# Patient Record
Sex: Male | Born: 1980 | Race: White | Hispanic: No | Smoking: Never smoker
Health system: Southern US, Community
[De-identification: ages and names within clinical notes are randomized; demographics above are authoritative.]

## PROBLEM LIST (undated history)

## (undated) ENCOUNTER — Emergency Department: Admission: EM | Disposition: A | Discharge status: Home / Self Care | Payer: Self-pay

## (undated) DIAGNOSIS — E079 Disorder of thyroid, unspecified: Secondary | ICD-10-CM

## (undated) DIAGNOSIS — E039 Hypothyroidism, unspecified: Secondary | ICD-10-CM

## (undated) DIAGNOSIS — I10 Essential (primary) hypertension: Secondary | ICD-10-CM

## (undated) HISTORY — PX: HERNIA REPAIR: SHX51

## (undated) HISTORY — PX: KNEE SURGERY: SHX244

---

## 2009-07-04 ENCOUNTER — Ambulatory Visit: Payer: Self-pay | Admitting: Internal Medicine

## 2009-12-09 ENCOUNTER — Ambulatory Visit: Payer: Self-pay | Admitting: Internal Medicine

## 2010-12-09 ENCOUNTER — Ambulatory Visit: Payer: Self-pay | Admitting: Family Medicine

## 2011-04-07 ENCOUNTER — Ambulatory Visit: Payer: Self-pay | Admitting: Family Medicine

## 2012-02-21 ENCOUNTER — Ambulatory Visit: Payer: Self-pay

## 2012-02-22 ENCOUNTER — Ambulatory Visit: Payer: Self-pay | Admitting: Internal Medicine

## 2012-02-23 ENCOUNTER — Ambulatory Visit: Payer: Self-pay | Admitting: Family Medicine

## 2012-02-25 ENCOUNTER — Ambulatory Visit: Payer: Self-pay | Admitting: Internal Medicine

## 2012-09-16 ENCOUNTER — Ambulatory Visit: Payer: Self-pay | Admitting: Internal Medicine

## 2013-01-15 ENCOUNTER — Ambulatory Visit: Payer: Self-pay

## 2013-01-17 ENCOUNTER — Ambulatory Visit: Payer: Self-pay | Admitting: Emergency Medicine

## 2014-07-29 ENCOUNTER — Ambulatory Visit: Payer: Self-pay | Admitting: Physician Assistant

## 2014-12-13 ENCOUNTER — Ambulatory Visit
Admit: 2014-12-13 | Disposition: A | Discharge status: Home / Self Care | Payer: Self-pay | Attending: Family Medicine | Admitting: Family Medicine

## 2016-09-19 ENCOUNTER — Ambulatory Visit
Admission: EM | Admit: 2016-09-19 | Discharge: 2016-09-19 | Disposition: A | Discharge status: Home / Self Care | Payer: Self-pay

## 2018-11-22 ENCOUNTER — Other Ambulatory Visit: Payer: Self-pay

## 2018-11-22 ENCOUNTER — Ambulatory Visit
Admission: EM | Admit: 2018-11-22 | Discharge: 2018-11-22 | Disposition: A | Discharge status: Home / Self Care | Payer: Self-pay | Attending: Family Medicine | Admitting: Family Medicine

## 2018-11-22 ENCOUNTER — Ambulatory Visit (INDEPENDENT_AMBULATORY_CARE_PROVIDER_SITE_OTHER): Payer: Self-pay

## 2018-11-22 ENCOUNTER — Encounter: Payer: Self-pay | Admitting: Emergency Medicine

## 2018-11-22 DIAGNOSIS — R509 Fever, unspecified: Secondary | ICD-10-CM

## 2018-11-22 DIAGNOSIS — J209 Acute bronchitis, unspecified: Secondary | ICD-10-CM

## 2018-11-22 DIAGNOSIS — R05 Cough: Secondary | ICD-10-CM

## 2018-11-22 HISTORY — DX: Essential (primary) hypertension: I10

## 2018-11-22 HISTORY — DX: Disorder of thyroid, unspecified: E07.9

## 2018-11-22 MED ORDER — AZITHROMYCIN 250 MG PO TABS: ORAL_TABLET | ORAL | 0 refills | Status: DC

## 2018-11-22 MED ORDER — HYDROCODONE-HOMATROPINE 5-1.5 MG/5ML PO SYRP: 5.0000 mL | ORAL_SOLUTION | Freq: Four times a day (QID) | ORAL | 0 refills | Status: DC | PRN

## 2018-11-22 NOTE — ED Triage Notes (Signed)
Patient c/o fever, cough, chills and bodyaches that started on Saturday. He had recent exposure to his girlfriend on Tuesday who tested positive for the flu.

## 2018-11-22 NOTE — Discharge Instructions (Signed)
Rest  Medication as prescribed.  Take care  Dr. Atziry Baranski  

## 2018-11-22 NOTE — ED Provider Notes (Signed)
[facility]    CSN: [id number] Arrival date & time: 11/22/18  1544  History   Chief Complaint Chief Complaint  Patient presents with  . Fever    APPT  . Cough  . Generalized Body Aches   HPI  38 year old male presents with cough.  Patient reports that he has been feeling poorly since 3/2.  He appears to have contracted influenza.  Several individuals were at a party/gathering and subsequently ended up having influenza.  He is girlfriend tested positive on 3/3.  Patient states that he continues to have low-grade fever, T-max 100.  He reports cough and associated chest congestion.  Patient feels that he has not improved.  No known exacerbating or relieving factors.  Patient is concerned that it has "moved to my chest".  No other associated symptoms.  No other complaints.  Hx reviewed as below. Past Medical History:  Diagnosis Date  . Hypertension   . Thyroid disease    History reviewed. No pertinent surgical history.  Home Medications    Prior to Admission medications   Medication Sig Start Date End Date Taking? Authorizing Provider  diltiazem (TIAZAC) 240 MG 24 hr capsule Take 240 mg by mouth daily.   Yes [provider]  hydrochlorothiazide (HYDRODIURIL) 25 MG tablet Take 25 mg by mouth daily.   Yes [provider]  levothyroxine (SYNTHROID, LEVOTHROID) 25 MCG tablet Take 25 mcg by mouth daily before breakfast.   Yes [provider]  azithromycin (ZITHROMAX) 250 MG tablet 2 tablets on day 1, then 1 tablet daily on days 2-5. 11/22/18   Lavel Rieman, Verdis Frederickson, DO  HYDROcodone-homatropine (HYCODAN) 5-1.5 MG/5ML syrup Take 5 mLs by mouth every 6 (six) hours as needed. 11/22/18   Tommie Sams, DO   Social History Social History   Tobacco Use  . Smoking status: Never Smoker  . Smokeless tobacco: Current User    Types: Chew  Substance Use Topics  . Alcohol use: Not Currently  . Drug use: Never     Allergies   Patient has no known allergies.   Review of Systems Review of Systems  Constitutional: Positive for fever.  Respiratory: Positive for cough.        Chest congestion.   Physical Exam Triage Vital Signs ED Triage Vitals  Enc Vitals Group     BP 11/22/18 1552 (!) 137/98     Pulse Rate 11/22/18 1552 78     Resp 11/22/18 1552 18     Temp 11/22/18 1552 98.2 F (36.8 C)     Temp Source 11/22/18 1552 Oral     SpO2 11/22/18 1552 99 %     Weight 11/22/18 1549 270 lb (122.5 kg)     Height 11/22/18 1549 6\' 4"  (1.93 m)     Head Circumference --      Peak Flow --      Pain Score 11/22/18 1549 0     Pain Loc --      Pain Edu? --      Excl. in GC? --    Updated Vital Signs BP (!) 137/98 (BP Location: Right Arm)   Pulse 78   Temp 98.2 F (36.8 C) (Oral)   Resp 18   Ht 6\' 4"  (1.93 m)   Wt 122.5 kg   SpO2 99%   BMI 32.87 kg/m   Visual Acuity Right Eye Distance:   Left Eye Distance:   Bilateral Distance:    Right Eye Near:   Left  Eye Near:    Bilateral Near:     Physical Exam Vitals signs and nursing note reviewed.  Constitutional:      General: He is not in acute distress.    Appearance: Normal appearance. He is obese.  HENT:     Head: Normocephalic and atraumatic.     Right Ear: Tympanic membrane normal.     Left Ear: Tympanic membrane normal.     Mouth/Throat:     Pharynx: Oropharynx is clear. Posterior oropharyngeal erythema present. No oropharyngeal exudate.  Eyes:     General:        Right eye: No discharge.        Left eye: No discharge.     Conjunctiva/sclera: Conjunctivae normal.  Cardiovascular:     Rate and Rhythm: Normal rate and regular rhythm.  Pulmonary:     Effort: Pulmonary effort is normal.     Breath sounds: Normal breath sounds.  Neurological:     Mental Status: He is alert.  Psychiatric:        Mood and Affect: Mood normal.        Behavior: Behavior normal.    UC Treatments / Results  Labs (all labs ordered are listed, but only abnormal results are displayed) Labs  Reviewed - No data to display  EKG None  Radiology Dg Chest 2 View  Result Date: 11/22/2018 CLINICAL DATA:  39 year old male with fever, cough, chills and body aches. Recent exposure to influenza. EXAM: CHEST - 2 VIEW COMPARISON:  None. FINDINGS: The lungs are clear and negative for focal airspace consolidation, pulmonary edema or suspicious pulmonary nodule. No pleural effusion or pneumothorax. Cardiac and mediastinal contours are within normal limits. No acute fracture or lytic or blastic osseous lesions. The visualized upper abdominal bowel gas pattern is unremarkable. IMPRESSION: Negative chest x-ray. Electronically Signed   By: Malachy Moan M.D.   On: 11/22/2018 16:25    Procedures Procedures (including critical care time)  Medications Ordered in UC Medications - No data to display  Initial Impression / Assessment and Plan / UC Course  I have reviewed the triage vital signs and the nursing notes.  Pertinent labs & imaging results that were available during my care of the patient were reviewed by me and considered in my medical decision making (see chart for details).    38 year old male presents with acute bronchitis.  X-ray negative.  Concern for superimposed bacterial infection given recent influenza.  Treating with azithromycin and Hycodan for cough.  Final Clinical Impressions(s) / UC Diagnoses   Final diagnoses:  Acute bronchitis, unspecified organism     Discharge Instructions     Rest.   Medication as prescribed.  Take care  Dr. Adriana Simas     ED Prescriptions    Medication Sig Dispense Auth. Provider   azithromycin (ZITHROMAX) 250 MG tablet 2 tablets on day 1, then 1 tablet daily on days 2-5. 6 tablet Zamari Bonsall G, DO   HYDROcodone-homatropine (HYCODAN) 5-1.5 MG/5ML syrup Take 5 mLs by mouth every 6 (six) hours as needed. 120 mL Tommie Sams, DO     Controlled Substance Prescriptions [place] Controlled Substance Registry consulted? Not Applicable   Tommie Sams, DO 11/22/18 1712

## 2019-04-23 ENCOUNTER — Ambulatory Visit
Admission: EM | Admit: 2019-04-23 | Discharge: 2019-04-23 | Disposition: A | Discharge status: Home / Self Care | Payer: Self-pay | Attending: Family Medicine | Admitting: Family Medicine

## 2019-04-23 ENCOUNTER — Ambulatory Visit (INDEPENDENT_AMBULATORY_CARE_PROVIDER_SITE_OTHER): Payer: Self-pay

## 2019-04-23 ENCOUNTER — Other Ambulatory Visit: Payer: Self-pay

## 2019-04-23 DIAGNOSIS — S52124A Nondisplaced fracture of head of right radius, initial encounter for closed fracture: Secondary | ICD-10-CM

## 2019-04-23 DIAGNOSIS — W1692XA Jumping or diving into unspecified water causing other injury, initial encounter: Secondary | ICD-10-CM

## 2019-04-23 DIAGNOSIS — S29012A Strain of muscle and tendon of back wall of thorax, initial encounter: Secondary | ICD-10-CM

## 2019-04-23 DIAGNOSIS — M25521 Pain in right elbow: Secondary | ICD-10-CM

## 2019-04-23 DIAGNOSIS — S5000XA Contusion of unspecified elbow, initial encounter: Secondary | ICD-10-CM

## 2019-04-23 DIAGNOSIS — S5001XA Contusion of right elbow, initial encounter: Secondary | ICD-10-CM

## 2019-04-23 DIAGNOSIS — S29019A Strain of muscle and tendon of unspecified wall of thorax, initial encounter: Secondary | ICD-10-CM

## 2019-04-23 DIAGNOSIS — S300XXA Contusion of lower back and pelvis, initial encounter: Secondary | ICD-10-CM

## 2019-04-23 DIAGNOSIS — M545 Low back pain: Secondary | ICD-10-CM

## 2019-04-23 MED ORDER — OXYCODONE-ACETAMINOPHEN 5-325 MG PO TABS: 1.0000 | ORAL_TABLET | Freq: Three times a day (TID) | ORAL | 0 refills | Status: DC | PRN

## 2019-04-23 NOTE — Discharge Instructions (Addendum)
Take medication as prescribed.  Over-the-counter ibuprofen as needed.  Keep in splint.  Ice.  Elevate.  Rest. Drink plenty of fluids.   Follow-up with orthopedic this coming week as discussed.  See above to call to schedule.  Follow up with your primary care physician this week as needed. Return to Urgent care for new or worsening concerns.
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
## 2019-05-15 ENCOUNTER — Telehealth: Payer: Self-pay

## 2019-05-15 ENCOUNTER — Ambulatory Visit (INDEPENDENT_AMBULATORY_CARE_PROVIDER_SITE_OTHER)
Admission: RE | Admit: 2019-05-15 | Discharge: 2019-05-15 | Disposition: A | Discharge status: Home / Self Care | Payer: Self-pay | Source: Ambulatory Visit

## 2019-05-15 DIAGNOSIS — J0140 Acute pansinusitis, unspecified: Secondary | ICD-10-CM

## 2019-05-15 DIAGNOSIS — Z20822 Contact with and (suspected) exposure to covid-19: Secondary | ICD-10-CM

## 2019-05-15 DIAGNOSIS — Z20828 Contact with and (suspected) exposure to other viral communicable diseases: Secondary | ICD-10-CM

## 2019-05-15 MED ORDER — AMOXICILLIN-POT CLAVULANATE 875-125 MG PO TABS: 1.0000 | ORAL_TABLET | Freq: Two times a day (BID) | ORAL | 0 refills | Status: AC

## 2019-05-15 NOTE — ED Provider Notes (Signed)
[place]    Virtual Visit via Video Note:  Wesley Cortez.  initiated request for Telemedicine visit with [name]. I connected with Wesley Cortez.  on 05/15/2019 at 1:13 PM  for a synchronized telemedicine visit using a telephone enabled HIPPA compliant telemedicine application. I verified that I am speaking with Wesley Cortez.  using two identifiers. Wesley Box, PA-C  was physically located in a [place] [facility] site and Wesley Cortez [employer]. was located at a different location.   The limitations of evaluation and management by telemedicine as well as the availability of in-person appointments were discussed. Patient was informed that he  may incur a bill ( including co-pay) for this virtual visit encounter. Wesley Gamer Sturgell Jr.  expressed understanding and gave verbal consent to proceed with virtual visit.  [id number] 05/15/19 Arrival Time: 1257  Cc: "sinus infection"  SUBJECTIVE:  Wesley Cortez. is a 38 y.o. male who presents with sinus congestion and pain x 5 days.  Denies sick exposure to COVID, flu or strep.  Denies recent travel.  However, does mention fiance is a Therapist, sports at [employer] for pediatric oncology.  Recently she was tested for COVID for flu-like symptoms and had a negative COVID test.  Localizes discomfort to the frontal and maxillary sinuses.  Has tried OTC medications with minimal relief.  Reports previous symptoms in the past related to sinus infection.  Complains of malaise, sinus HA, cough, PND, and mild nausea.   Denies fever, chills, SOB, wheezing, chest pain, vomiting, changes in bowel or bladder habits.    ROS: As per HPI.  All other pertinent ROS negative.     Past Medical History:  Diagnosis Date  . Hypertension   . Thyroid disease    History reviewed. No pertinent surgical history. No Known Allergies No current facility-administered medications on file prior to encounter.    Current  Outpatient Medications on File Prior to Encounter  Medication Sig Dispense Refill  . FLUoxetine (PROZAC) 10 MG tablet Take 10 mg by mouth daily.    Marland Kitchen amphetamine-dextroamphetamine (ADDERALL XR) 15 MG 24 hr capsule TK ONE C PO QD    . diltiazem (TIAZAC) 240 MG 24 hr capsule Take 240 mg by mouth daily.    Marland Kitchen escitalopram (LEXAPRO) 10 MG tablet TK 1 T PO QD    . gabapentin (NEURONTIN) 300 MG capsule TK 1 TO 3 CS PO QHS    . hydrochlorothiazide (HYDRODIURIL) 25 MG tablet Take 25 mg by mouth daily.    Marland Kitchen levothyroxine (SYNTHROID, LEVOTHROID) 25 MCG tablet Take 25 mcg by mouth daily before breakfast.        OBJECTIVE:  There were no vitals filed for this visit.  PE done over telephone  General: alert; no distress HENT: no hot potato voice Lungs: normal respiratory effort; speaking in full sentences without difficulty Neurologic: No slurred speech  Psychological: alert and cooperative; normal mood and affect  ASSESSMENT & PLAN:  1. Suspected Covid-19 Virus Infection   2. Acute non-recurrent pansinusitis     Meds ordered this encounter  Medications  . amoxicillin-clavulanate (AUGMENTIN) 875-125 MG tablet    Sig: Take 1 tablet by mouth every 12 (twelve) hours for 10 days.    Dispense:  20 tablet    Refill:  0    Order Specific Question:   Supervising Provider    Answer:   Wesley Cortez [[id number]]    Orders Placed This  Encounter  Procedures  . Novel Coronavirus, NAA ([facility])    Order Specific Question:   Is this test for diagnosis or screening    Answer:   Diagnosis of ill patient    Order Specific Question:   Symptomatic for COVID-19 as defined by CDC    Answer:   Yes    Order Specific Question:   Date of Symptom Onset    Answer:   05/10/2019    Order Specific Question:   Hospitalized for COVID-19    Answer:   No    Order Specific Question:   Admitted to ICU for COVID-19    Answer:   No    Order Specific Question:   Previously tested for COVID-19    Answer:   No     Order Specific Question:   Resident in a congregate (group) care setting    Answer:   No    Order Specific Question:   Is the patient student?    Answer:   No    Order Specific Question:   Employed in healthcare setting    Answer:   No    COVID testing ordered.  You may go to the drive-through testing center in [place].  Address is [address] In [place], [place].  Opened M-F; 8-4p  In the meantime: You should remain isolated in your home for 10 days from symptom onset AND greater than 72 hours after symptoms resolution (absence of fever without the use of fever-reducing medication and improvement in respiratory symptoms), whichever is longer Get plenty of rest and push fluids Based on symptoms I will go ahead and treat you for possible sinus infection as well.  Take antibiotic as prescribed and to completion.   Use OTC medications like ibuprofen or tylenol as needed fever or pain Follow up in person, call or go to the ED if you have any new or worsening symptoms such as fever, worsening cough, shortness of breath, chest tightness, chest pain, turning blue, changes in mental status, etc...   I discussed the assessment and treatment plan with the patient. The patient was provided an opportunity to ask questions and all were answered. The patient agreed with the plan and demonstrated an understanding of the instructions.   The patient was advised to call back or seek an in-person evaluation if the symptoms worsen or if the condition fails to improve as anticipated.  I provided 15 minutes of non-face-to-face time during this encounter.  [name], PA-C  05/15/2019 1:13 PM           Wesley HardingWurst, Wesley Longstreth, PA-C 05/15/19 1409

## 2019-05-15 NOTE — Discharge Instructions (Addendum)
COVID testing ordered.  You may go to the drive-through testing center in [place].  Address is [address] [place], [place].  Opened M-F; 8-4p  In the meantime: You should remain isolated in your home for 10 days from symptom onset AND greater than 72 hours after symptoms resolution (absence of fever without the use of fever-reducing medication and improvement in respiratory symptoms), whichever is longer Get plenty of rest and push fluids Based on symptoms I will go ahead and treat you for possible sinus infection as well.  Take antibiotic as prescribed and to completion.   Use OTC medications like ibuprofen or tylenol as needed fever or pain Follow up in person, call or go to the ED if you have any new or worsening symptoms such as fever, worsening cough, shortness of breath, chest tightness, chest pain, turning blue, changes in mental status, etc..Marland Kitchen

## 2019-05-16 ENCOUNTER — Other Ambulatory Visit: Payer: Self-pay

## 2019-05-16 DIAGNOSIS — Z20822 Contact with and (suspected) exposure to covid-19: Secondary | ICD-10-CM

## 2019-05-17 LAB — NOVEL CORONAVIRUS, NAA: SARS-CoV-2, NAA: NOT DETECTED

## 2019-06-01 ENCOUNTER — Telehealth: Payer: Self-pay

## 2019-06-01 ENCOUNTER — Encounter: Payer: Self-pay | Admitting: Emergency Medicine

## 2019-06-01 ENCOUNTER — Other Ambulatory Visit: Payer: Self-pay

## 2019-06-01 ENCOUNTER — Ambulatory Visit
Admission: EM | Admit: 2019-06-01 | Discharge: 2019-06-01 | Disposition: A | Discharge status: Home / Self Care | Payer: Self-pay | Attending: Family Medicine | Admitting: Family Medicine

## 2019-06-01 DIAGNOSIS — R6889 Other general symptoms and signs: Secondary | ICD-10-CM

## 2019-06-01 DIAGNOSIS — R05 Cough: Secondary | ICD-10-CM

## 2019-06-01 DIAGNOSIS — Z20822 Contact with and (suspected) exposure to covid-19: Secondary | ICD-10-CM

## 2019-06-01 DIAGNOSIS — J029 Acute pharyngitis, unspecified: Secondary | ICD-10-CM

## 2019-06-01 LAB — RAPID INFLUENZA A&B ANTIGENS
Influenza A (ARMC): NEGATIVE
Influenza B (ARMC): NEGATIVE

## 2019-06-01 LAB — RAPID STREP SCREEN (MED CTR MEBANE ONLY): Streptococcus, Group A Screen (Direct): NEGATIVE

## 2019-06-01 MED ORDER — MELOXICAM 15 MG PO TABS: 15.0000 mg | ORAL_TABLET | Freq: Every day | ORAL | 0 refills | Status: DC | PRN

## 2019-06-01 MED ORDER — BENZONATATE 200 MG PO CAPS: 200.0000 mg | ORAL_CAPSULE | Freq: Three times a day (TID) | ORAL | 0 refills | Status: DC | PRN

## 2019-06-01 NOTE — Discharge Instructions (Signed)
Rest. Fluids.  Tylenol as needed.  Mobic for pain/body aches.  Cough medication as needed.  If you worsen, go to the ER.  Take care  Dr. Lacinda Axon

## 2019-06-01 NOTE — ED Triage Notes (Addendum)
Pt c/o fever(102), chills, SOB, sore throat, body aches, cough, shortness of breath and  Nausea. Started about 3 days ago. Pt last too ibuprofen about 10:30 am

## 2019-06-01 NOTE — ED Provider Notes (Signed)
[facility]    CSN: [id number] Arrival date & time: 06/01/19  1225  History   Chief Complaint Chief Complaint  Patient presents with  . Fever  . Cough  . Sore Throat   HPI  38 year old male presents with respiratory symptoms.  Patient reports that he has been sick for the past 3 days.  He reports cough, congestion, sore throat, fever, chills, weakness, fatigue, shortness of breath, headache, body aches.  No reported sick contacts.  He states his fever has been as high as 102.  States that he feels very poorly.  He took ibuprofen this morning.  No known exacerbating or relieving factors.  He is concerned that he may have a sinus infection.  I am concerned that he may have COVID-19.  Patient states that his symptoms are similar to when he has been flu previously.  He rates his pain as a 5/10 in severity.  No other associated symptoms.  No other complaints.  History reviewed and updated as below.  Past Medical History:  Diagnosis Date  . Hypertension   . Thyroid disease    Past Surgical History:  Procedure Laterality Date  . NO PAST SURGERIES     Home Medications    Prior to Admission medications   Medication Sig Start Date End Date Taking? Authorizing Provider  amphetamine-dextroamphetamine (ADDERALL XR) 15 MG 24 hr capsule TK ONE C PO QD 03/28/19  Yes [provider]  diltiazem (TIAZAC) 240 MG 24 hr capsule Take 240 mg by mouth daily.   Yes [provider]  FLUoxetine (PROZAC) 10 MG tablet Take 10 mg by mouth daily.   Yes [provider]  gabapentin (NEURONTIN) 300 MG capsule TK 1 TO 3 CS PO QHS 04/12/19  Yes [provider]  hydrochlorothiazide (HYDRODIURIL) 25 MG tablet Take 25 mg by mouth daily.   Yes [provider]  levothyroxine (SYNTHROID, LEVOTHROID) 25 MCG tablet Take 25 mcg by mouth daily before breakfast.   Yes [provider]  benzonatate (TESSALON) 200 MG capsule Take 1 capsule (200 mg total) by mouth  3 (three) times daily as needed for cough. 06/01/19   Coral Spikes, DO  escitalopram (LEXAPRO) 10 MG tablet TK 1 T PO QD 04/12/19   [provider]  meloxicam (MOBIC) 15 MG tablet Take 1 tablet (15 mg total) by mouth daily as needed for pain. 06/01/19   Coral Spikes, DO   Social History Social History   Tobacco Use  . Smoking status: Never Smoker  . Smokeless tobacco: Current User    Types: Chew  Substance Use Topics  . Alcohol use: Not Currently  . Drug use: Never   Allergies   Patient has no known allergies.   Review of Systems Review of Systems Per HPI  Physical Exam Triage Vital Signs ED Triage Vitals  Enc Vitals Group     BP 06/01/19 1249 (!) 123/92     Pulse Rate 06/01/19 1249 (!) 105     Resp 06/01/19 1249 20     Temp 06/01/19 1249 99.2 F (37.3 C)     Temp Source 06/01/19 1249 Oral     SpO2 06/01/19 1249 99 %     Weight 06/01/19 1244 275 lb (124.7 kg)     Height 06/01/19 1244 6\' 4"  (1.93 m)     Head Circumference --      Peak Flow --      Pain Score 06/01/19 1243 5     Pain  Loc --      Pain Edu? --      Excl. in GC? --    Updated Vital Signs BP (!) 123/92 (BP Location: Left Arm)   Pulse (!) 105   Temp 99.2 F (37.3 C) (Oral)   Resp 20   Ht 6\' 4"  (1.93 m)   Wt 124.7 kg   SpO2 99%   BMI 33.47 kg/m   Visual Acuity Right Eye Distance:   Left Eye Distance:   Bilateral Distance:    Right Eye Near:   Left Eye Near:    Bilateral Near:     Physical Exam Vitals signs and nursing note reviewed.  Constitutional:      General: He is not in acute distress.    Appearance: Normal appearance. He is not ill-appearing.  HENT:     Head: Normocephalic and atraumatic.     Right Ear: Tympanic membrane normal.     Left Ear: Tympanic membrane normal.     Mouth/Throat:     Pharynx: Posterior oropharyngeal erythema present. No oropharyngeal exudate.  Eyes:     General:        Right eye: No discharge.        Left eye: No discharge.      Conjunctiva/sclera: Conjunctivae normal.  Cardiovascular:     Rate and Rhythm: Normal rate and regular rhythm.  Pulmonary:     Effort: Pulmonary effort is normal.     Breath sounds: Normal breath sounds. No wheezing, rhonchi or rales.  Neurological:     Mental Status: He is alert.  Psychiatric:        Mood and Affect: Mood normal.        Behavior: Behavior normal.    UC Treatments / Results  Labs (all labs ordered are listed, but only abnormal results are displayed) Labs Reviewed  RAPID STREP SCREEN ([facility] ONLY)  RAPID INFLUENZA A&B ANTIGENS ([facility] ONLY)  NOVEL CORONAVIRUS, NAA (HOSP ORDER, SEND-OUT TO REF LAB; TAT 18-24 HRS)  CULTURE, GROUP A STREP [facility])    EKG   Radiology No results found.  Procedures Procedures (including critical care time)  Medications Ordered in UC Medications - No data to display  Initial Impression / Assessment and Plan / UC Course  I have reviewed the triage vital signs and the nursing notes.  Pertinent labs & imaging results that were available during my care of the patient were reviewed by me and considered in my medical decision making (see chart for details).    38 year old male presents with suspected COVID-19.  Flu negative.  Strep negative.  Symptomatic care.  Meloxicam as needed for pain and body aches.  Tessalon Perles for cough.  Awaiting COVID-19 test results.  Final Clinical Impressions(s) / UC Diagnoses   Final diagnoses:  Suspected Covid-19 Virus Infection     Discharge Instructions     Rest. Fluids.  Tylenol as needed.  Mobic for pain/body aches.  Cough medication as needed.  If you worsen, go to the ER.  Take care  Dr. Adriana Simasook    ED Prescriptions    Medication Sig Dispense Auth. Provider   meloxicam (MOBIC) 15 MG tablet Take 1 tablet (15 mg total) by mouth daily as needed for pain. 14 tablet Oyindamola Key G, DO   benzonatate (TESSALON) 200 MG capsule Take 1 capsule (200 mg total) by mouth 3 (three)  times daily as needed for cough. 30 capsule Tommie Samsook, Shaul Trautman G, DO     PDMP not reviewed this  encounter.   Tommie Sams, [place] 06/01/19 1511

## 2019-06-02 LAB — NOVEL CORONAVIRUS, NAA (HOSP ORDER, SEND-OUT TO REF LAB; TAT 18-24 HRS): SARS-CoV-2, NAA: NOT DETECTED

## 2019-06-04 ENCOUNTER — Telehealth (HOSPITAL_COMMUNITY): Payer: Self-pay | Admitting: Emergency Medicine

## 2019-06-04 ENCOUNTER — Encounter (HOSPITAL_COMMUNITY): Payer: Self-pay

## 2019-06-04 LAB — CULTURE, GROUP A STREP (THRC)

## 2019-06-04 MED ORDER — AMOXICILLIN 500 MG PO CAPS: 500.0000 mg | ORAL_CAPSULE | Freq: Two times a day (BID) | ORAL | 0 refills | Status: AC

## 2019-06-04 NOTE — Telephone Encounter (Signed)
Attempted to reach patient. No answer at this time. Voicemail left. Will send amoxicillin per Dr. Lanny Cramp since pt still symptomatic.

## 2019-06-04 NOTE — Telephone Encounter (Signed)
Pt returned call, informed of his strep culture. Pt c/o continued symptoms. Pt informed that we sent antibiotic for him and if still having symptoms once finished, to follow up with PCP. Pt agreeable to plan.

## 2019-07-07 ENCOUNTER — Other Ambulatory Visit: Payer: Self-pay | Admitting: Family Medicine

## 2019-07-11 ENCOUNTER — Ambulatory Visit (INDEPENDENT_AMBULATORY_CARE_PROVIDER_SITE_OTHER): Payer: Self-pay

## 2019-07-11 ENCOUNTER — Encounter: Payer: Self-pay | Admitting: Emergency Medicine

## 2019-07-11 ENCOUNTER — Ambulatory Visit
Admission: EM | Admit: 2019-07-11 | Discharge: 2019-07-11 | Disposition: A | Discharge status: Home / Self Care | Payer: Self-pay | Attending: Family Medicine | Admitting: Family Medicine

## 2019-07-11 ENCOUNTER — Other Ambulatory Visit: Payer: Self-pay

## 2019-07-11 DIAGNOSIS — R05 Cough: Secondary | ICD-10-CM

## 2019-07-11 DIAGNOSIS — R058 Other specified cough: Secondary | ICD-10-CM

## 2019-07-11 DIAGNOSIS — R059 Cough, unspecified: Secondary | ICD-10-CM

## 2019-07-11 DIAGNOSIS — Z7189 Other specified counseling: Secondary | ICD-10-CM

## 2019-07-11 MED ORDER — ALBUTEROL SULFATE HFA 108 (90 BASE) MCG/ACT IN AERS: 2.0000 | INHALATION_SPRAY | Freq: Four times a day (QID) | RESPIRATORY_TRACT | 0 refills | Status: DC | PRN

## 2019-07-11 MED ORDER — PREDNISONE 10 MG PO TABS: ORAL_TABLET | ORAL | 0 refills | Status: DC

## 2019-07-11 MED ORDER — HYDROCOD POLST-CPM POLST ER 10-8 MG/5ML PO SUER: 5.0000 mL | Freq: Every evening | ORAL | 0 refills | Status: DC | PRN

## 2019-07-11 MED ORDER — DOXYCYCLINE HYCLATE 100 MG PO CAPS: 100.0000 mg | ORAL_CAPSULE | Freq: Two times a day (BID) | ORAL | 0 refills | Status: DC

## 2019-07-11 NOTE — ED Provider Notes (Signed)
[facility] ____________________________________________  Time seen: Approximately 2:44 PM  I have reviewed the triage vital signs and the nursing notes.   HISTORY  Chief Complaint Cough  HPI Wesley Cortez. is a 38 y.o. male presenting for evaluation of cough congestion complaints present for approximately 1.5 months.  Patient reports symptoms initially started with flulike symptoms including fever, cough, congestion and sore throat.  States sore throat has since resolved, was tested positive for atypical strep and was treated with amoxicillin.  Patient reports Covid testing with it was initially negative.  Patient reports continues with postnasal drainage and cough.  Some soreness with cough and chest.  Denies chest pain.  Denies shortness of breath but does report he feels congestion and has to take deeper breaths sometimes.  Has coughing fits second to deep breaths or talking or laughing.  Cough worse at night.  Unresolved with over-the-counter medications.  Does report since initial testing he has had a positive Covid 19 exposure approximately 3 weeks ago.  No recent fevers.  Denies current change in taste or smell.  States had some changes in taste or smell initially.  Has continued to overall remain active.  Denies other aggravating or alleviating factors.   Past Medical History:  Diagnosis Date   Hypertension    Thyroid disease     There are no active problems to display for this patient.   Past Surgical History:  Procedure Laterality Date   NO PAST SURGERIES       No current facility-administered medications for this encounter.   Current Outpatient Medications:    amphetamine-dextroamphetamine (ADDERALL XR) 15 MG 24 hr capsule, TK ONE C PO QD, Disp: , Rfl:    diltiazem (TIAZAC) 240 MG 24 hr capsule, Take 240 mg by mouth daily., Disp: , Rfl:    FLUoxetine (PROZAC) 10 MG tablet, Take 10 mg by mouth daily., Disp: , Rfl:    gabapentin  (NEURONTIN) 300 MG capsule, TK 1 TO 3 CS PO QHS, Disp: , Rfl:    hydrochlorothiazide (HYDRODIURIL) 25 MG tablet, Take 25 mg by mouth daily., Disp: , Rfl:    levothyroxine (SYNTHROID, LEVOTHROID) 25 MCG tablet, Take 25 mcg by mouth daily before breakfast., Disp: , Rfl:    albuterol (VENTOLIN HFA) 108 (90 Base) MCG/ACT inhaler, Inhale 2 puffs into the lungs every 6 (six) hours as needed for wheezing., Disp: 6.7 g, Rfl: 0   chlorpheniramine-HYDROcodone (TUSSIONEX PENNKINETIC ER) 10-8 MG/5ML SUER, Take 5 mLs by mouth at bedtime as needed. do not drive or operate machinery while taking as can cause drowsiness., Disp: 40 mL, Rfl: 0   doxycycline (VIBRAMYCIN) 100 MG capsule, Take 1 capsule (100 mg total) by mouth 2 (two) times daily., Disp: 20 capsule, Rfl: 0   escitalopram (LEXAPRO) 10 MG tablet, TK 1 T PO QD, Disp: , Rfl:    predniSONE (DELTASONE) 10 MG tablet, Start 60 mg po day one, then 50 mg po day two, taper by 10 mg daily until complete., Disp: 21 tablet, Rfl: 0  Allergies Patient has no known allergies.  History reviewed. No pertinent family history.  Social History Social History   Tobacco Use   Smoking status: Never Smoker   Smokeless tobacco: Current User    Types: Chew  Substance Use Topics   Alcohol use: Not Currently   Drug use: Never    Review of Systems Constitutional: No fever. Eyes:As above.  Cardiovascular: Denies chest pain. Respiratory: Denies shortness of breath. Gastrointestinal: No abdominal pain.  No nausea, no vomiting.  No diarrhea.  Genitourinary: Negative for dysuria. Musculoskeletal: Negative for back pain. Skin: Negative for rash.   ____________________________________________   PHYSICAL EXAM:  VITAL SIGNS: ED Triage Vitals  Enc Vitals Group     BP 07/11/19 1415 (!) 142/101     Pulse Rate 07/11/19 1415 91     Resp 07/11/19 1415 18     Temp 07/11/19 1415 98.5 F (36.9 C)     Temp Source 07/11/19 1415 Oral     SpO2 07/11/19 1415 98  %     Weight 07/11/19 1411 265 lb (120.2 kg)     Height 07/11/19 1411 6\' 4"  (1.93 m)     Head Circumference --      Peak Flow --      Pain Score 07/11/19 1411 3     Pain Loc --      Pain Edu? --      Excl. in GC? --    Constitutional: Alert and oriented. Well appearing and in no acute distress. Eyes: Conjunctivae are normal. PERRL. EOMI. Head: Atraumatic. No sinus tenderness to palpation. No swelling. No erythema.  Ears: no erythema, normal TMs bilaterally.   Nose:Nasal congestion   Mouth/Throat: Mucous membranes are moist. No pharyngeal erythema. No tonsillar swelling or exudate.  Neck: No stridor.  No cervical spine tenderness to palpation. Hematological/Lymphatic/Immunilogical: No cervical lymphadenopathy. Cardiovascular: Normal rate, regular rhythm. Grossly normal heart sounds.  Good peripheral circulation. Respiratory: Normal respiratory effort.  No retractions. No wheezes, rales or rhonchi.  Mild left-sided rhonchi.  Speaks in complete sentences. Musculoskeletal: Ambulatory with steady gait.  No extremity edema noted. Neurologic:  Normal speech and language. No gait instability. Skin:  Skin appears warm, dry and intact. No rash noted. Psychiatric: Mood and affect are normal. Speech and behavior are normal. ___________________________________________   LABS (all labs ordered are listed, but only abnormal results are displayed)  Labs Reviewed  NOVEL CORONAVIRUS, NAA (HOSP ORDER, SEND-OUT TO REF LAB; TAT 18-24 HRS)   _______________________________________  RADIOLOGY  Dg Chest 2 View  Result Date: 07/11/2019 CLINICAL DATA:  Cough for 1.5 months EXAM: CHEST - 2 VIEW COMPARISON:  11/22/2018 FINDINGS: The heart size and mediastinal contours are within normal limits. Both lungs are clear. Minimal atelectasis or scar at the left base. The visualized skeletal structures are unremarkable. IMPRESSION: No active cardiopulmonary disease. Electronically Signed   By: 01/22/2019 M.D.    On: 07/11/2019 15:04   ____________________________________________   PROCEDURES Procedures    INITIAL IMPRESSION / ASSESSMENT AND PLAN / ED COURSE  Pertinent labs & imaging results that were available during my care of the patient were reviewed by me and considered in my medical decision making (see chart for details).  Overall well-appearing patient.  Does not appear ill.  Discussed likely initial viral illness.  As with more recent positive exposure, will repeat COVID-19 testing.  Chest x-ray as above per radiologist, reviewed, no active cardiopulmonary disease.  His cough has extended greater than 3 weeks, will empirically treat with oral doxycycline, prednisone taper, albuterol and as needed Tussionex.  Encourage rest, fluids, supportive care.Discussed indication, risks and benefits of medications with patient.  Discussed follow up with Primary care physician this week. Discussed follow up and return parameters including no resolution or any worsening concerns. Patient verbalized understanding and agreed to plan.   ____________________________________________   FINAL CLINICAL IMPRESSION(S) / ED DIAGNOSES  Final diagnoses:  Cough  Cough present for greater than 3 weeks  Advice  given about COVID-19 virus infection     ED Discharge Orders         Ordered    doxycycline (VIBRAMYCIN) 100 MG capsule  2 times daily     07/11/19 1510    predniSONE (DELTASONE) 10 MG tablet     07/11/19 1510    albuterol (VENTOLIN HFA) 108 (90 Base) MCG/ACT inhaler  Every 6 hours PRN     07/11/19 1510    chlorpheniramine-HYDROcodone (TUSSIONEX PENNKINETIC ER) 10-8 MG/5ML SUER  At bedtime PRN     07/11/19 1510           Note: This dictation was prepared with Dragon dictation along with smaller phrase technology. Any transcriptional errors that result from this process are unintentional.         Renford DillsMiller, Karalyn Kadel, NP 07/11/19 1535

## 2019-07-11 NOTE — ED Triage Notes (Signed)
Patient states he has had a non-productive cough since he was seen on 09/18.  He states he coughs "violently" and it is very painful when he coughs. 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 Patient states when he presses on his chest he has pain.

## 2019-07-11 NOTE — Discharge Instructions (Signed)
Take medication as prescribed. Rest. Drink plenty of fluids. Monitor.  ° °Follow up with your primary care physician this week as needed. Return to Urgent care for new or worsening concerns.  ° °

## 2019-07-12 LAB — NOVEL CORONAVIRUS, NAA (HOSP ORDER, SEND-OUT TO REF LAB; TAT 18-24 HRS): SARS-CoV-2, NAA: NOT DETECTED

## 2019-08-22 ENCOUNTER — Other Ambulatory Visit: Payer: Self-pay

## 2019-08-22 ENCOUNTER — Ambulatory Visit
Admission: EM | Admit: 2019-08-22 | Discharge: 2019-08-22 | Disposition: A | Discharge status: Home / Self Care | Payer: Self-pay | Attending: Family Medicine | Admitting: Family Medicine

## 2019-08-22 ENCOUNTER — Ambulatory Visit (INDEPENDENT_AMBULATORY_CARE_PROVIDER_SITE_OTHER): Payer: Self-pay

## 2019-08-22 ENCOUNTER — Encounter: Payer: Self-pay | Admitting: Emergency Medicine

## 2019-08-22 DIAGNOSIS — S93401A Sprain of unspecified ligament of right ankle, initial encounter: Secondary | ICD-10-CM

## 2019-08-22 DIAGNOSIS — M25571 Pain in right ankle and joints of right foot: Secondary | ICD-10-CM

## 2019-08-22 MED ORDER — MELOXICAM 15 MG PO TABS: 15.0000 mg | ORAL_TABLET | Freq: Every day | ORAL | 0 refills | Status: DC | PRN

## 2019-08-22 NOTE — Discharge Instructions (Signed)
Rest. Ice. Elevation.  Medication as prescribed.  Take care  Dr. Lacinda Axon

## 2019-08-22 NOTE — ED Provider Notes (Signed)
[facility]    CSN: [id number] Arrival date & time: 08/22/19  1641   History   Chief Complaint Chief Complaint  Patient presents with  . Ankle Injury    DOI 08/22/19   HPI  38 year old male presents with an ankle injury.  Patient states that he was coming down a ladder and missed a step.  He inadvertently twisted his right ankle.  Patient reports that he subsequently developed pain, swelling.  Patient has a small abrasion to the lateral aspect of the right ankle.  He rates his pain as 10/10 in severity.  No medications or interventions tried.  Patient came directly in for evaluation.  Worse with activity.  He is having difficulty bearing weight.  No relieving factors.  No other complaints.  PMH, Surgical Hx, Family Hx, Social History reviewed and updated as below.  Past Medical History:  Diagnosis Date  . Hypertension   . Thyroid disease    Past Surgical History:  Procedure Laterality Date  . HERNIA REPAIR    . KNEE SURGERY Left    Home Medications    Prior to Admission medications   Medication Sig Start Date End Date Taking? Authorizing Provider  amphetamine-dextroamphetamine (ADDERALL XR) 15 MG 24 hr capsule TK ONE C PO QD 03/28/19  Yes [provider]  diltiazem (TIAZAC) 240 MG 24 hr capsule Take 240 mg by mouth daily.   Yes [provider]  FLUoxetine (PROZAC) 10 MG tablet Take 10 mg by mouth daily.   Yes [provider]  gabapentin (NEURONTIN) 300 MG capsule TK 1 TO 3 CS PO QHS 04/12/19  Yes [provider]  hydrochlorothiazide (HYDRODIURIL) 25 MG tablet Take 25 mg by mouth daily.   Yes [provider]  levothyroxine (SYNTHROID, LEVOTHROID) 25 MCG tablet Take 25 mcg by mouth daily before breakfast.   Yes [provider]  meloxicam (MOBIC) 15 MG tablet Take 1 tablet (15 mg total) by mouth daily as needed for pain. 08/22/19   Tommie Samsook, Haven Pylant G, DO  albuterol (VENTOLIN HFA) 108 (90 Base) MCG/ACT inhaler Inhale 2  puffs into the lungs every 6 (six) hours as needed for wheezing. 07/11/19 08/22/19  Renford DillsMiller, [place], NP  escitalopram (LEXAPRO) 10 MG tablet TK 1 T PO QD 04/12/19 08/22/19  [provider]    Family History Family History  Problem Relation Age of Onset  . Allergies Mother   . Diabetes Mother        diet controlled  . Hypertension Mother   . Heart attack Father [id number]  . Lung cancer Father   . Hypertension Father   . Hyperlipidemia Father     Social History Social History   Tobacco Use  . Smoking status: Never Smoker  . Smokeless tobacco: Current User    Types: Chew  Substance Use Topics  . Alcohol use: Not Currently  . Drug use: Never     Allergies   Patient has no known allergies.   Review of Systems Review of Systems  Constitutional: Negative.   Musculoskeletal:       Right ankle pain/swelling.   Physical Exam Triage Vital Signs ED Triage Vitals  Enc Vitals Group     BP 08/22/19 1723 (!) 115/91     Pulse Rate 08/22/19 1723 97     Resp 08/22/19 1723 18     Temp 08/22/19 1723 98 F (36.7 C)     Temp Source 08/22/19 1723 Oral     SpO2 08/22/19 1723 97 %  Weight 08/22/19 1723 280 lb (127 kg)     Height 08/22/19 1723 6\' 4"  (1.93 m)     Head Circumference --      Peak Flow --      Pain Score 08/22/19 1722 10     Pain Loc --      Pain Edu? --      Excl. in GC? --    Updated Vital Signs BP (!) 115/91 (BP Location: Left Arm)   Pulse 97   Temp 98 F (36.7 C) (Oral)   Resp 18   Ht 6\' 4"  (1.93 m)   Wt 127 kg   SpO2 97%   BMI 34.08 kg/m   Visual Acuity Right Eye Distance:   Left Eye Distance:   Bilateral Distance:    Right Eye Near:   Left Eye Near:    Bilateral Near:     Physical Exam Vitals signs and nursing note reviewed.  Constitutional:      General: He is not in acute distress.    Appearance: Normal appearance. He is not ill-appearing.  HENT:     Head: Normocephalic and atraumatic.  Eyes:     General:        Right eye: No  discharge.        Left eye: No discharge.     Conjunctiva/sclera: Conjunctivae normal.  Cardiovascular:     Rate and Rhythm: Normal rate and regular rhythm.     Heart sounds: No murmur.  Pulmonary:     Effort: Pulmonary effort is normal.     Breath sounds: Normal breath sounds. No wheezing, rhonchi or rales.  Musculoskeletal:     Comments: Right ankle -swelling noted of the lateral malleolus.  Tenderness over the medial and lateral malleolus.  Neurological:     Mental Status: He is alert.  Psychiatric:        Mood and Affect: Mood normal.        Behavior: Behavior normal.    UC Treatments / Results  Labs (all labs ordered are listed, but only abnormal results are displayed) Labs Reviewed - No data to display  EKG   Radiology Dg Ankle Complete Right  Result Date: 08/22/2019 CLINICAL DATA:  Twisted ankle today. EXAM: RIGHT ANKLE - COMPLETE 3+ VIEW COMPARISON:  None. FINDINGS: Normal alignment no fracture. Lateral soft tissue swelling. No joint effusion. IMPRESSION: Lateral soft tissue swelling.  Negative for fracture Electronically Signed   By: M.D.   On: 08/22/2019 17:52    Procedures Procedures (including critical care time)  Medications Ordered in UC Medications - No data to display  Initial Impression / Assessment and Plan / UC Course  I have reviewed the triage vital signs and the nursing notes.  Pertinent labs & imaging results that were available during my care of the patient were reviewed by me and considered in my medical decision making (see chart for details).    38 year old male presents with an ankle sprain.  X-ray negative.  Rest, ice, elevation.  Meloxicam as directed.  Placed in a cam walker.  Supportive care.  Final Clinical Impressions(s) / UC Diagnoses   Final diagnoses:  Sprain of right ankle, unspecified ligament, initial encounter     Discharge Instructions     Rest. Ice. Elevation.  Medication as prescribed.  Take care   Dr. 14/05/2019    ED Prescriptions    Medication Sig Dispense Auth. Provider   meloxicam (MOBIC) 15 MG tablet Take 1 tablet (15 mg total)  by mouth daily as needed for pain. 30 tablet Coral Spikes, DO     PDMP not reviewed this encounter.   Coral Spikes, [place] 08/22/19 1949

## 2019-08-22 NOTE — ED Triage Notes (Signed)
Patient in today c/o right ankle injury today. Patient states he missed the last step of a ladder and twisted his ankle and fell.

## 2019-08-23 ENCOUNTER — Telehealth (HOSPITAL_COMMUNITY): Payer: Self-pay | Admitting: Urgent Care

## 2019-08-23 MED ORDER — KETOROLAC TROMETHAMINE 10 MG PO TABS: 10.0000 mg | ORAL_TABLET | Freq: Four times a day (QID) | ORAL | 0 refills | Status: DC | PRN

## 2019-08-23 NOTE — Telephone Encounter (Signed)
    Date: 08/23/2019  Name: Leana Gamer [employer]. DOB: 1981-03-30 MRN: [id number]  Re: Request for alternative intervention for pain  Spoke with Dr. Lacinda Axon regarding patient's complaints and presentation. Cook, DO asked me to switch patient to ketorolac. Rx to be sent to patient's pharmacy on file for the following medication.   Meds ordered this encounter  Medications  . ketorolac (TORADOL) 10 MG tablet    Sig: Take 1 tablet (10 mg total) by mouth every 6 (six) hours as needed.    Dispense:  20 tablet    Refill:  0   Honor Loh, MSN, APRN, FNP-C, CEN Advanced Practice Provider [facility] 08/23/2019 9:43 AM

## 2019-08-23 NOTE — Telephone Encounter (Signed)
Patient called stating the Meloxicam is not working for his pain and he was unable to sleep last night. He is requesting something stronger.

## 2019-11-12 DIAGNOSIS — U071 COVID-19: Secondary | ICD-10-CM

## 2019-11-12 HISTORY — DX: COVID-19: U07.1

## 2019-12-14 ENCOUNTER — Ambulatory Visit: Payer: Self-pay

## 2019-12-31 ENCOUNTER — Ambulatory Visit (INDEPENDENT_AMBULATORY_CARE_PROVIDER_SITE_OTHER)
Admission: RE | Admit: 2019-12-31 | Discharge: 2019-12-31 | Disposition: A | Discharge status: Other Acute Inpatient Hospital | Payer: Self-pay | Source: Ambulatory Visit

## 2019-12-31 ENCOUNTER — Other Ambulatory Visit: Payer: Self-pay

## 2019-12-31 DIAGNOSIS — R509 Fever, unspecified: Secondary | ICD-10-CM

## 2019-12-31 DIAGNOSIS — U071 COVID-19: Secondary | ICD-10-CM

## 2019-12-31 DIAGNOSIS — R0602 Shortness of breath: Secondary | ICD-10-CM

## 2019-12-31 DIAGNOSIS — R058 Other specified cough: Secondary | ICD-10-CM

## 2019-12-31 DIAGNOSIS — R05 Cough: Secondary | ICD-10-CM

## 2019-12-31 MED ORDER — BENZONATATE 100 MG PO CAPS: 100.0000 mg | ORAL_CAPSULE | Freq: Three times a day (TID) | ORAL | 0 refills | Status: DC | PRN

## 2019-12-31 MED ORDER — PROMETHAZINE-DM 6.25-15 MG/5ML PO SYRP: 5.0000 mL | ORAL_SOLUTION | Freq: Every evening | ORAL | 0 refills | Status: DC | PRN

## 2019-12-31 NOTE — ED Provider Notes (Addendum)
Virtual Visit via Video Note:  Wesley Cortez.  initiated request for Telemedicine visit with [facility] team. I connected with Wesley Cortez.  on 12/31/2019 at 4:35 PM  for a synchronized telemedicine visit using a video enabled HIPPA compliant telemedicine application. I verified that I am speaking with Wesley Cortez.  using two identifiers. Jaynee Eagles, PA-C  was physically located in a [facility]gent care site and Christy Friede [employer]. was located at a different location.   The limitations of evaluation and management by telemedicine as well as the availability of in-person appointments were discussed. Patient was informed that he  may incur a bill ( including co-pay) for this virtual visit encounter. Leana Gamer Chrisley Jr.  expressed understanding and gave verbal consent to proceed with virtual visit.     History of Present Illness:Wesley Cortez.  is a 39 y.o. male presents with 3 day hx of acute onset fever (ranges between 102-104F), difficulty shob, moderate-severe cough that started becoming productive yesterday. No hemoptysis. Patient has monitored his pulse oximetry, just walking one end to the house and back, feels winded/shob but his has not dropped below 94%. Ranges up to 96%. His daughter tested positive for COVID 19 on 12/26/2019. Has quarantined since then. However, he has 2 other children that have started to show symptoms. He contacted the health department and was advised to get tested this week but was not having sx at the time. Has a hx of allergies, sinus infections. Has been taking APAP, ibuprofen with very temporary relief. Has had otc cold medications. He is taking Zinc, Vit C. Has had minimal relief with these medications. Denies hx of lung disorders.   ROS  No current facility-administered medications for this encounter.   Current Outpatient Medications  Medication Sig Dispense Refill  . amphetamine-dextroamphetamine  (ADDERALL XR) 15 MG 24 hr capsule TK ONE C PO QD    . diltiazem (TIAZAC) 240 MG 24 hr capsule Take 240 mg by mouth daily.    Marland Kitchen FLUoxetine (PROZAC) 10 MG tablet Take 10 mg by mouth daily.    Marland Kitchen gabapentin (NEURONTIN) 300 MG capsule TK 1 TO 3 CS PO QHS    . hydrochlorothiazide (HYDRODIURIL) 25 MG tablet Take 25 mg by mouth daily.    Marland Kitchen ketorolac (TORADOL) 10 MG tablet Take 1 tablet (10 mg total) by mouth every 6 (six) hours as needed. 20 tablet 0  . levothyroxine (SYNTHROID, LEVOTHROID) 25 MCG tablet Take 25 mcg by mouth daily before breakfast.    . meloxicam (MOBIC) 15 MG tablet Take 1 tablet (15 mg total) by mouth daily as needed for pain. 30 tablet 0     No Known Allergies   Past Medical History:  Diagnosis Date  . Hypertension   . Thyroid disease     Past Surgical History:  Procedure Laterality Date  . HERNIA REPAIR    . KNEE SURGERY Left       Observations/Objective: Physical Exam Constitutional:      General: He is not in acute distress.    Appearance: Normal appearance. He is ill-appearing (lethargic). He is not toxic-appearing or diaphoretic.  HENT:     Head: Atraumatic.  Eyes:     General:        Right eye: No discharge.        Left eye: No discharge.     Extraocular Movements: Extraocular movements intact.  Pulmonary:     Effort: Pulmonary  effort is normal.  Neurological:     Mental Status: He is alert and oriented to person, place, and time.  Psychiatric:        Mood and Affect: Mood normal.        Behavior: Behavior normal.        Thought Content: Thought content normal.        Judgment: Judgment normal.      Assessment and Plan:  PDMP not reviewed this encounter.  1. Clinical diagnosis of COVID-19   2. Productive cough   3. Shortness of breath   4. Fever, unspecified    Given severity of his sx, patient is to report to the ER for further evaluation especially since we do not have x-ray on site; counseled that he needs COVID 19 testing, consideration  for chest x-ray, labs. Offered Tessalon, cough syrup medications. Recommended supportive care otherwise. Patient is agreeable and will try to find a way to have someone watch his kids while he presents to the ER.    Follow Up Instructions:    I discussed the assessment and treatment plan with the patient. The patient was provided an opportunity to ask questions and all were answered. The patient agreed with the plan and demonstrated an understanding of the instructions.   The patient was advised to call back or seek an in-person evaluation if the symptoms worsen or if the condition fails to improve as anticipated.  I provided 20 minutes of non-face-to-face time during this encounter.    Wallis Bamberg, PA-C  12/31/2019 4:35 PM        Wallis Bamberg, PA-C 12/31/19 1656

## 2019-12-31 NOTE — Discharge Instructions (Addendum)
We will manage this as a viral syndrome, very likely that you have COVID 19. For sore throat or cough try using a honey-based tea. Use 3 teaspoons of honey with juice squeezed from half lemon. Place shaved pieces of ginger into 1/2-1 cup of water and warm over stove top. Then mix the ingredients and repeat every 4 hours as needed. Please take Tylenol 500mg -650mg  once every 6 hours for fevers, aches and pains. Hydrate very well with at least 2 liters (64 ounces) of water. Eat light meals such as soups (chicken and noodles, chicken wild rice, vegetable).  Do not eat any foods that you are allergic to.  Start an antihistamine like Zyrtec, Allegra or Claritin for postnasal drainage, sinus congestion. You can take this together with pseudoephedrine (Sudafed) at a dose of 60 mg  three times a day or twice daily as needed for the same kind of congestion. However, limit your use of pseudoephedrine if you have high blood pressure or avoid altogether if you have abnormal heart rhythms, heart condition.

## 2020-01-01 ENCOUNTER — Other Ambulatory Visit: Payer: Self-pay

## 2020-01-01 ENCOUNTER — Ambulatory Visit (INDEPENDENT_AMBULATORY_CARE_PROVIDER_SITE_OTHER): Payer: HRSA Program

## 2020-01-01 ENCOUNTER — Ambulatory Visit
Admission: EM | Admit: 2020-01-01 | Discharge: 2020-01-01 | Disposition: A | Discharge status: Home / Self Care | Payer: HRSA Program | Attending: Family Medicine | Admitting: Family Medicine

## 2020-01-01 DIAGNOSIS — J988 Other specified respiratory disorders: Secondary | ICD-10-CM | POA: Diagnosis present

## 2020-01-01 DIAGNOSIS — U071 COVID-19: Secondary | ICD-10-CM | POA: Insufficient documentation

## 2020-01-01 DIAGNOSIS — Z20822 Contact with and (suspected) exposure to covid-19: Secondary | ICD-10-CM

## 2020-01-01 MED ORDER — KETOROLAC TROMETHAMINE 10 MG PO TABS: 10.0000 mg | ORAL_TABLET | Freq: Four times a day (QID) | ORAL | 0 refills | Status: DC | PRN

## 2020-01-01 MED ORDER — HYDROCOD POLST-CPM POLST ER 10-8 MG/5ML PO SUER: 5.0000 mL | Freq: Two times a day (BID) | ORAL | 0 refills | Status: DC | PRN

## 2020-01-01 NOTE — Discharge Instructions (Signed)
Medication as prescribed.  Rest. Fluids.  Stay home.  Take care  Dr. Adriana Simas

## 2020-01-01 NOTE — ED Provider Notes (Signed)
[facility]    CSN: [id number] Arrival date & time: 01/01/20  1113      History   Chief Complaint Chief Complaint  Patient presents with  . Covid Exposure  . COVID symptoms   HPI  39 year old male presents with the above complaints.  Patient reports that his symptoms started on Saturday.  He reports productive cough, shortness of breath, fever (T-max 104.1), body aches.  Developed diarrhea today.  Patient's daughter recently tested positive for COVID-19 on Wednesday.  His son is also sick.  He has been taking Tylenol, Motrin, and cold medicine without improvement.  He had a virtual visit yesterday and was encouraged to go to the ER for evaluation.  He was placed on Tessalon Perles and Phenergan cough syrup.  He states that he has had some improvement in his cough but it is still quite persistent and severe.  Patient feels poorly.  He rates his pain as 7/10 in severity.  He is concerned that he has COVID-19.  No other complaints.  Past Medical History:  Diagnosis Date  . Hypertension   . Thyroid disease    Past Surgical History:  Procedure Laterality Date  . HERNIA REPAIR    . KNEE SURGERY Left    Home Medications    Prior to Admission medications   Medication Sig Start Date End Date Taking? Authorizing Provider  amphetamine-dextroamphetamine (ADDERALL XR) 15 MG 24 hr capsule TK ONE C PO QD 03/28/19  Yes [provider]  benzonatate (TESSALON) 100 MG capsule Take 1-2 capsules (100-200 mg total) by mouth 3 (three) times daily as needed. 12/31/19  Yes Jaynee Eagles, PA-C  diltiazem [facility]) 240 MG 24 hr capsule Take 240 mg by mouth daily.   Yes [provider]  DULoxetine (CYMBALTA) 60 MG capsule Take 60 mg by mouth daily. 12/20/19  Yes [provider]  gabapentin (NEURONTIN) 300 MG capsule TK 1 TO 3 CS PO QHS 04/12/19  Yes [provider]  hydrochlorothiazide (HYDRODIURIL) 25 MG tablet Take 25 mg by mouth daily.   Yes [provider]  levothyroxine (SYNTHROID, LEVOTHROID) 25 MCG tablet Take 25 mcg by mouth daily before breakfast.   Yes [provider]  promethazine-dextromethorphan (PROMETHAZINE-DM) 6.25-15 MG/5ML syrup Take 5 mLs by mouth at bedtime as needed for cough. 12/31/19  Yes Jaynee Eagles, PA-C  chlorpheniramine-HYDROcodone (TUSSIONEX PENNKINETIC ER) 10-8 MG/5ML SUER Take 5 mLs by mouth every 12 (twelve) hours as needed. 01/01/20   Coral Spikes, DO  ketorolac (TORADOL) 10 MG tablet Take 1 tablet (10 mg total) by mouth every 6 (six) hours as needed for moderate pain or severe pain. 01/01/20   Coral Spikes, DO  albuterol (VENTOLIN HFA) 108 (90 Base) MCG/ACT inhaler Inhale 2 puffs into the lungs every 6 (six) hours as needed for wheezing. 07/11/19 08/22/19  Marylene Land, NP  escitalopram (LEXAPRO) 10 MG tablet TK 1 T PO QD 04/12/19 08/22/19  [provider]    Family History Family History  Problem Relation Age of Onset  . Allergies Mother   . Diabetes Mother        diet controlled  . Hypertension Mother   . Heart attack Father 72  . Lung cancer Father   . Hypertension Father   . Hyperlipidemia Father     Social History Social History   Tobacco Use  . Smoking status: Never Smoker  . Smokeless tobacco: Current User    Types: Chew  Substance Use Topics  . Alcohol  use: Not Currently  . Drug use: Never     Allergies   Patient has no known allergies.   Review of Systems Review of Systems  Constitutional: Positive for fever.  Respiratory: Positive for cough.   Gastrointestinal: Positive for diarrhea.  Musculoskeletal:       Body aches.   Physical Exam Triage Vital Signs ED Triage Vitals  Enc Vitals Group     BP 01/01/20 1202 (!) 119/92     Pulse Rate 01/01/20 1202 (!) 107     Resp 01/01/20 1202 18     Temp 01/01/20 1202 98.7 F (37.1 C)     Temp Source 01/01/20 1202 Oral     SpO2 01/01/20 1202 98 %     Weight 01/01/20 1156 265 lb (120.2 kg)     Height  01/01/20 1156 6\' 4"  (1.93 m)     Head Circumference --      Peak Flow --      Pain Score 01/01/20 1155 7     Pain Loc --      Pain Edu? --      Excl. in GC? --    Updated Vital Signs BP (!) 119/92 (BP Location: Left Arm)   Pulse (!) 107   Temp 98.7 F (37.1 C) (Oral)   Resp 18   Ht 6\' 4"  (1.93 m)   Wt 120.2 kg   SpO2 98%   BMI 32.26 kg/m   Visual Acuity Right Eye Distance:   Left Eye Distance:   Bilateral Distance:    Right Eye Near:   Left Eye Near:    Bilateral Near:     Physical Exam Vitals and nursing note reviewed.  Constitutional:      General: He is not in acute distress.    Appearance: Normal appearance. He is not ill-appearing.  HENT:     Head: Normocephalic and atraumatic.  Eyes:     General:        Right eye: No discharge.        Left eye: No discharge.     Conjunctiva/sclera: Conjunctivae normal.  Cardiovascular:     Rate and Rhythm: Normal rate and regular rhythm.     Heart sounds: No murmur.  Pulmonary:     Effort: Pulmonary effort is normal.     Breath sounds: Normal breath sounds. No wheezing, rhonchi or rales.  Neurological:     Mental Status: He is alert.  Psychiatric:        Mood and Affect: Mood normal.        Behavior: Behavior normal.    UC Treatments / Results  Labs (all labs ordered are listed, but only abnormal results are displayed) Labs Reviewed  SARS CORONAVIRUS 2 (TAT 6-24 HRS)    EKG   Radiology DG Chest 2 View  Result Date: 01/01/2020 CLINICAL DATA:  Fever, cough, and body aches.  COVID exposure. EXAM: CHEST - 2 VIEW COMPARISON:  Chest x-ray dated July 11, 2019. FINDINGS: The heart size and mediastinal contours are within normal limits. Both lungs are clear. The visualized skeletal structures are unremarkable. IMPRESSION: No active cardiopulmonary disease. Electronically Signed   By: 01/03/2020 M.D.   On: 01/01/2020 12:59    Procedures Procedures (including critical care time)  Medications Ordered in  UC Medications - No data to display  Initial Impression / Assessment and Plan / UC Course  I have reviewed the triage vital signs and the nursing notes.  Pertinent labs & imaging results that  were available during my care of the patient were reviewed by me and considered in my medical decision making (see chart for details).    39 year old male presents with respiratory infection.  Given his exposure to COVID-19, I clinically suspect that he has COVID-19.  Awaiting test result.  Chest x-ray was performed today given respiratory symptoms and fever.  Chest x-ray interpreted independently by me.  Interpretation: No acute cardiopulmonary findings.  No evidence of edema or consolidation.  Treating body aches with Toradol.  Test next for cough.  Advised to stay home.  Final Clinical Impressions(s) / UC Diagnoses   Final diagnoses:  Respiratory infection  Encounter for laboratory testing for COVID-19 virus     Discharge Instructions     Medication as prescribed.  Rest. Fluids.  Stay home.  Take care  Dr. Adriana Simas    ED Prescriptions    Medication Sig Dispense Auth. Provider   chlorpheniramine-HYDROcodone (TUSSIONEX PENNKINETIC ER) 10-8 MG/5ML SUER Take 5 mLs by mouth every 12 (twelve) hours as needed. 115 mL Chavie Kolinski G, DO   ketorolac (TORADOL) 10 MG tablet Take 1 tablet (10 mg total) by mouth every 6 (six) hours as needed for moderate pain or severe pain. 20 tablet Tommie Sams, DO     PDMP not reviewed this encounter.   Tommie Sams, [place] 01/01/20 1319

## 2020-01-01 NOTE — ED Triage Notes (Signed)
Pt presents with c/o COVID exposure, fever (104.1 Sunday), nasal congestion, productive cough (green mucus), diarrhea and body aches. Pt states symptoms started this past Saturday. Pt has been taking Tylenol, Motrin, cold medicine to treat his symptoms. Pt did have a video visit yesterday and was given Tessalon and cough medicine which have helped some. Pt reports his daughter tested POS to COVID last Wednesday.

## 2020-01-02 LAB — SARS CORONAVIRUS 2 (TAT 6-24 HRS): SARS Coronavirus 2: POSITIVE — AB

## 2020-04-29 ENCOUNTER — Other Ambulatory Visit: Payer: Self-pay | Admitting: Podiatry

## 2020-05-12 ENCOUNTER — Other Ambulatory Visit: Payer: Self-pay

## 2020-05-12 ENCOUNTER — Other Ambulatory Visit
Admission: RE | Admit: 2020-05-12 | Discharge: 2020-05-12 | Disposition: A | Discharge status: Home / Self Care | Payer: BLUE CROSS/BLUE SHIELD | Source: Ambulatory Visit | Attending: Podiatry | Admitting: Podiatry

## 2020-05-12 ENCOUNTER — Encounter: Payer: Self-pay | Admitting: Urgent Care

## 2020-05-12 ENCOUNTER — Encounter: Payer: Self-pay | Admitting: Podiatry

## 2020-05-12 DIAGNOSIS — Z01812 Encounter for preprocedural laboratory examination: Secondary | ICD-10-CM | POA: Diagnosis present

## 2020-05-12 DIAGNOSIS — Z20822 Contact with and (suspected) exposure to covid-19: Secondary | ICD-10-CM | POA: Diagnosis not present

## 2020-05-12 LAB — SARS CORONAVIRUS 2 (TAT 6-24 HRS): SARS Coronavirus 2: NEGATIVE

## 2020-05-13 NOTE — Discharge Instructions (Signed)
[facility]  POST OPERATIVE INSTRUCTIONS FOR DR. TROXLER, DR. FOWLER, AND DR. [facility] PODIATRY DEPARTMENT   1. Take your medication as prescribed.  Pain medication should be taken only as needed.  2. Keep the dressing clean, dry and intact.  3. Keep your foot elevated above the heart level for the first 48 hours.  4. Walking to the bathroom and brief periods of walking are acceptable, unless we have instructed you to be non-weight bearing.  5. Always wear your post-op shoe when walking.  Always use your crutches if you are to be non-weight bearing.  6. Do not take a shower. Baths are permissible as long as the foot is kept out of the water.   7. Every hour you are awake:  - Bend your knee 15 times. - Flex foot 15 times - Massage calf 15 times  8. Call [facility] ([phone]) if any of the following problems occur: - You develop a temperature or fever. - The bandage becomes saturated with blood. - Medication does not stop your pain. - Injury of the foot occurs. - Any symptoms of infection including redness, odor, or red streaks running from wound.   General Anesthesia, Adult, Care After This sheet gives you information about how to care for yourself after your procedure. Your health care provider may also give you more specific instructions. If you have problems or questions, contact your health care provider. What can I expect after the procedure? After the procedure, the following side effects are common:  Pain or discomfort at the IV site.  Nausea.  Vomiting.  Sore throat.  Trouble concentrating.  Feeling cold or chills.  Weak or tired.  Sleepiness and fatigue.  Soreness and body aches. These side effects can affect parts of the body that were not involved in surgery. Follow these instructions at home:  For at least 24 hours after the procedure:  Have a responsible adult stay with you. It is  important to have someone help care for you until you are awake and alert.  Rest as needed.  Do not: ? Participate in activities in which you could fall or become injured. ? Drive. ? Use heavy machinery. ? Drink alcohol. ? Take sleeping pills or medicines that cause drowsiness. ? Make important decisions or sign legal documents. ? Take care of children on your own. Eating and drinking  Follow any instructions from your health care provider about eating or drinking restrictions.  When you feel hungry, start by eating small amounts of foods that are soft and easy to digest (bland), such as toast. Gradually return to your regular diet.  Drink enough fluid to keep your urine pale yellow.  If you vomit, rehydrate by drinking water, juice, or clear broth. General instructions  If you have sleep apnea, surgery and certain medicines can increase your risk for breathing problems. Follow instructions from your health care provider about wearing your sleep device: ? Anytime you are sleeping, including during daytime naps. ? While taking prescription pain medicines, sleeping medicines, or medicines that make you drowsy.  Return to your normal activities as told by your health care provider. Ask your health care provider what activities are safe for you.  Take over-the-counter and prescription medicines only as told by your health care provider.  If you smoke, do not smoke without supervision.  Keep all follow-up visits as told by your health care provider. This is important. Contact a health care provider if:    You have nausea or vomiting that does not get better with medicine.  You cannot eat or drink without vomiting.  You have pain that does not get better with medicine.  You are unable to pass urine.  You develop a skin rash.  You have a fever.  You have redness around your IV site that gets worse. Get help right away if:  You have difficulty breathing.  You have chest  pain.  You have blood in your urine or stool, or you vomit blood. Summary  After the procedure, it is common to have a sore throat or nausea. It is also common to feel tired.  Have a responsible adult stay with you for the first 24 hours after general anesthesia. It is important to have someone help care for you until you are awake and alert.  When you feel hungry, start by eating small amounts of foods that are soft and easy to digest (bland), such as toast. Gradually return to your regular diet.  Drink enough fluid to keep your urine pale yellow.  Return to your normal activities as told by your health care provider. Ask your health care provider what activities are safe for you. This information is not intended to replace advice given to you by your health care provider. Make sure you discuss any questions you have with your health care provider. Document Revised: 09/02/2017 Document Reviewed: 04/15/2017 Elsevier Patient Education  2020 [employer]  

## 2020-05-14 ENCOUNTER — Encounter
Admission: RE | Disposition: A | Discharge status: Home / Self Care | Payer: Self-pay | Source: Home / Self Care | Attending: Podiatry

## 2020-05-14 ENCOUNTER — Ambulatory Visit: Payer: BLUE CROSS/BLUE SHIELD | Admitting: Anesthesiology

## 2020-05-14 ENCOUNTER — Ambulatory Visit
Admission: RE | Admit: 2020-05-14 | Discharge: 2020-05-14 | Disposition: A | Discharge status: Home / Self Care | Payer: BLUE CROSS/BLUE SHIELD | Attending: Podiatry | Admitting: Podiatry

## 2020-05-14 ENCOUNTER — Encounter: Payer: Self-pay | Admitting: Podiatry

## 2020-05-14 ENCOUNTER — Other Ambulatory Visit: Payer: Self-pay

## 2020-05-14 DIAGNOSIS — M659 Synovitis and tenosynovitis, unspecified: Secondary | ICD-10-CM | POA: Diagnosis not present

## 2020-05-14 DIAGNOSIS — S86311A Strain of muscle(s) and tendon(s) of peroneal muscle group at lower leg level, right leg, initial encounter: Secondary | ICD-10-CM | POA: Diagnosis not present

## 2020-05-14 DIAGNOSIS — I10 Essential (primary) hypertension: Secondary | ICD-10-CM | POA: Insufficient documentation

## 2020-05-14 DIAGNOSIS — Z79899 Other long term (current) drug therapy: Secondary | ICD-10-CM | POA: Insufficient documentation

## 2020-05-14 DIAGNOSIS — F17290 Nicotine dependence, other tobacco product, uncomplicated: Secondary | ICD-10-CM | POA: Diagnosis not present

## 2020-05-14 DIAGNOSIS — M25371 Other instability, right ankle: Secondary | ICD-10-CM | POA: Diagnosis not present

## 2020-05-14 DIAGNOSIS — Y939 Activity, unspecified: Secondary | ICD-10-CM | POA: Insufficient documentation

## 2020-05-14 DIAGNOSIS — E039 Hypothyroidism, unspecified: Secondary | ICD-10-CM | POA: Diagnosis not present

## 2020-05-14 DIAGNOSIS — S93421A Sprain of deltoid ligament of right ankle, initial encounter: Secondary | ICD-10-CM | POA: Insufficient documentation

## 2020-05-14 DIAGNOSIS — X58XXXA Exposure to other specified factors, initial encounter: Secondary | ICD-10-CM | POA: Insufficient documentation

## 2020-05-14 DIAGNOSIS — S96911A Strain of unspecified muscle and tendon at ankle and foot level, right foot, initial encounter: Secondary | ICD-10-CM | POA: Diagnosis present

## 2020-05-14 HISTORY — PX: ANKLE RECONSTRUCTION: SHX1151

## 2020-05-14 HISTORY — DX: Hypothyroidism, unspecified: E03.9

## 2020-05-14 HISTORY — PX: ANKLE ARTHROSCOPY: SHX545

## 2020-05-14 HISTORY — PX: TENDON REPAIR: SHX5111

## 2020-05-14 SURGERY — RECONSTRUCTION, ANKLE
Anesthesia: General | Site: Ankle | Laterality: Right

## 2020-05-14 MED ORDER — SCOPOLAMINE 1 MG/3DAYS TD PT72
1.0000 | MEDICATED_PATCH | Freq: Once | TRANSDERMAL | Status: DC
  Administered 2020-05-14: 1.5 mg via TRANSDERMAL

## 2020-05-14 MED ORDER — OXYCODONE-ACETAMINOPHEN 5-325 MG PO TABS: 1.0000 | ORAL_TABLET | Freq: Four times a day (QID) | ORAL | 0 refills | Status: AC | PRN

## 2020-05-14 MED ORDER — FENTANYL CITRATE (PF) 100 MCG/2ML IJ SOLN: 25.0000 ug | INTRAMUSCULAR | Status: DC | PRN

## 2020-05-14 MED ORDER — CEFAZOLIN SODIUM-DEXTROSE 2-4 GM/100ML-% IV SOLN
2.0000 g | INTRAVENOUS | Status: AC
  Administered 2020-05-14: 2 g via INTRAVENOUS

## 2020-05-14 MED ORDER — FENTANYL CITRATE (PF) 100 MCG/2ML IJ SOLN
INTRAMUSCULAR | Status: DC | PRN
  Administered 2020-05-14 (×4): 25 ug via INTRAVENOUS
  Administered 2020-05-14: 100 ug via INTRAVENOUS

## 2020-05-14 MED ORDER — LIDOCAINE HCL (CARDIAC) PF 100 MG/5ML IV SOSY
PREFILLED_SYRINGE | INTRAVENOUS | Status: DC | PRN
  Administered 2020-05-14: 30 mg via INTRATRACHEAL

## 2020-05-14 MED ORDER — BUPIVACAINE HCL 0.25 % IJ SOLN
INTRAMUSCULAR | Status: DC | PRN
  Administered 2020-05-14: 10 mL

## 2020-05-14 MED ORDER — POVIDONE-IODINE 7.5 % EX SOLN: Freq: Once | CUTANEOUS | Status: DC

## 2020-05-14 MED ORDER — LIDOCAINE-EPINEPHRINE 2 %-1:100000 IJ SOLN
INTRAMUSCULAR | Status: DC | PRN
  Administered 2020-05-14: 10 mL

## 2020-05-14 MED ORDER — ROPIVACAINE HCL 5 MG/ML IJ SOLN
INTRAMUSCULAR | Status: DC | PRN
  Administered 2020-05-14: 40 mL via PERINEURAL

## 2020-05-14 MED ORDER — ONDANSETRON HCL 4 MG/2ML IJ SOLN
INTRAMUSCULAR | Status: DC | PRN
  Administered 2020-05-14: 4 mg via INTRAVENOUS

## 2020-05-14 MED ORDER — PROPOFOL 10 MG/ML IV BOLUS
INTRAVENOUS | Status: DC | PRN
  Administered 2020-05-14: 200 mg via INTRAVENOUS
  Administered 2020-05-14: 30 mg via INTRAVENOUS
  Administered 2020-05-14: 50 mg via INTRAVENOUS

## 2020-05-14 MED ORDER — OXYCODONE HCL 5 MG PO TABS
5.0000 mg | ORAL_TABLET | Freq: Once | ORAL | Status: AC | PRN
  Administered 2020-05-14: 5 mg via ORAL

## 2020-05-14 MED ORDER — ONDANSETRON HCL 4 MG/2ML IJ SOLN: 4.0000 mg | Freq: Once | INTRAMUSCULAR | Status: DC | PRN

## 2020-05-14 MED ORDER — ACETAMINOPHEN 325 MG PO TABS: 325.0000 mg | ORAL_TABLET | ORAL | Status: DC | PRN

## 2020-05-14 MED ORDER — ACETAMINOPHEN 160 MG/5ML PO SOLN: 325.0000 mg | ORAL | Status: DC | PRN

## 2020-05-14 MED ORDER — DEXAMETHASONE SODIUM PHOSPHATE 4 MG/ML IJ SOLN
INTRAMUSCULAR | Status: DC | PRN
  Administered 2020-05-14: 4 mg via INTRAVENOUS

## 2020-05-14 MED ORDER — LACTATED RINGERS IV SOLN: INTRAVENOUS | Status: DC

## 2020-05-14 MED ORDER — MIDAZOLAM HCL 2 MG/2ML IJ SOLN
INTRAMUSCULAR | Status: DC | PRN
  Administered 2020-05-14: 2 mg via INTRAVENOUS

## 2020-05-14 MED ORDER — OXYCODONE HCL 5 MG/5ML PO SOLN: 5.0000 mg | Freq: Once | ORAL | Status: AC | PRN

## 2020-05-14 SURGICAL SUPPLY — 39 items
BLADE AGGRESSIVE PLUS 4.0 (BLADE) ×1 IMPLANT
BLADE SURG 15 STRL LF DISP TIS (BLADE) IMPLANT
BLADE SURG 15 STRL SS (BLADE) ×2
BNDG CMPR STD VLCR NS LF 5.8X4 (GAUZE/BANDAGES/DRESSINGS) ×1
BNDG COHESIVE 4X5 TAN STRL (GAUZE/BANDAGES/DRESSINGS) ×4 IMPLANT
BNDG ELASTIC 4X5.8 VLCR NS LF (GAUZE/BANDAGES/DRESSINGS) ×1 IMPLANT
BNDG ESMARK 4X12 TAN STRL LF (GAUZE/BANDAGES/DRESSINGS) ×2 IMPLANT
BNDG GAUZE 4.5X4.1 6PLY STRL (MISCELLANEOUS) ×2 IMPLANT
BUR AGGRESSIVE+ 2.5 (BURR) ×2 IMPLANT
COVER LIGHT HANDLE UNIVERSAL (MISCELLANEOUS) ×2 IMPLANT
CUFF TOURN SGL QUICK 34 (TOURNIQUET CUFF)
CUFF TRNQT CYL 34X4X40X1 (TOURNIQUET CUFF) IMPLANT
DURAPREP 26ML APPLICATOR (WOUND CARE) ×2 IMPLANT
ETHIBOND 2 0 GREEN CT 2 30IN (SUTURE) IMPLANT
GLOVE BIO SURGEON STRL SZ7.5 (GLOVE) ×3 IMPLANT
GLOVE INDICATOR 8.0 STRL GRN (GLOVE) ×3 IMPLANT
GOWN STRL REUS W/ TWL LRG LVL3 (GOWN DISPOSABLE) ×2 IMPLANT
GOWN STRL REUS W/TWL LRG LVL3 (GOWN DISPOSABLE) ×4
IV LACTATED RINGER IRRG 3000ML (IV SOLUTION) ×4
IV LR IRRIG 3000ML ARTHROMATIC (IV SOLUTION) ×2 IMPLANT
KIT TURNOVER KIT A (KITS) ×2 IMPLANT
MANIFOLD 4PT FOR NEPTUNE1 (MISCELLANEOUS) ×2 IMPLANT
PACK EXTREMITY ARMC (MISCELLANEOUS) ×2 IMPLANT
PENCIL SMOKE EVACUATOR (MISCELLANEOUS) IMPLANT
STOCKINETTE IMPERVIOUS LG (DRAPES) ×2 IMPLANT
SUT ETHIBOND GREEN BRAID 0S 4 (SUTURE) IMPLANT
SUT ETHILON 3-0 FS-10 30 BLK (SUTURE) ×2
SUT ETHILON 4-0 (SUTURE)
SUT ETHILON 4-0 FS2 18XMFL BLK (SUTURE)
SUT VIC AB 3-0 SH 27 (SUTURE) ×4
SUT VIC AB 3-0 SH 27X BRD (SUTURE) IMPLANT
SUT VIC AB 4-0 RB1 27 (SUTURE) ×2
SUT VIC AB 4-0 RB1 27X BRD (SUTURE) IMPLANT
SUT VIC AB 4-0 SH 27 (SUTURE)
SUT VIC AB 4-0 SH 27XANBCTRL (SUTURE) IMPLANT
SUTURE EHLN 3-0 FS-10 30 BLK (SUTURE) IMPLANT
SUTURE ETHLN 4-0 FS2 18XMF BLK (SUTURE) IMPLANT
TUBING ARTHRO INFLOW-ONLY STRL (TUBING) ×2 IMPLANT
WAND TOPAZ MICRO DEBRIDER (MISCELLANEOUS) ×2 IMPLANT

## 2020-05-14 NOTE — Anesthesia Procedure Notes (Signed)
Procedure Name: LMA Insertion Date/Time: 05/14/2020 12:12 PM Performed by: Maree Krabbe, CRNA Pre-anesthesia Checklist: Patient identified, Emergency Drugs available, Suction available, Timeout performed and Patient being monitored Patient Re-evaluated:Patient Re-evaluated prior to induction Oxygen Delivery Method: Circle system utilized Preoxygenation: Pre-oxygenation with 100% oxygen Induction Type: IV induction LMA: LMA inserted LMA Size: 5.0 Number of attempts: 1 Placement Confirmation: positive ETCO2 and breath sounds checked- equal and bilateral Tube secured with: Tape Dental Injury: Teeth and Oropharynx as per pre-operative assessment

## 2020-05-14 NOTE — Anesthesia Procedure Notes (Signed)
Anesthesia Regional Block: Popliteal block   Pre-Anesthetic Checklist: ,, timeout performed, Correct Patient, Correct Site, Correct Laterality, Correct Procedure, Correct Position, site marked, Risks and benefits discussed,  Surgical consent,  Pre-op evaluation,  At surgeon's request and post-op pain management  Laterality: Right  Prep: chloraprep       Needles:  Injection technique: Single-shot  Needle Type: Echogenic Needle     Needle Length: 9cm  Needle Gauge: 21     Additional Needles:   Procedures:,,,, ultrasound used (permanent image in chart),,,,  Narrative:  Start time: 05/14/2020 11:21 AM End time: 05/14/2020 11:30 AM Injection made incrementally with aspirations every 5 mL.  Performed by: Personally  Anesthesiologist: Ranee Gosselin, MD  Additional Notes: Functioning IV was confirmed and monitors applied. Ultrasound guidance: relevant anatomy identified, needle position confirmed, local anesthetic spread visualized around nerve(s)., vascular puncture avoided.  Image printed for medical record.  Negative aspiration and no paresthesias; incremental administration of local anesthetic. The patient tolerated the procedure well. Vitals signes recorded in RN notes. 30 ml

## 2020-05-14 NOTE — H&P (Signed)
HISTORY AND PHYSICAL INTERVAL NOTE:  05/14/2020  11:56 AM  Wesley Cortez.  has presented today for surgery, with the diagnosis of S86.311D  PERONEAL TENDON TEAR RIGHT S93.491A SPRAIN ANTERIOR TALOFIBULAR LIGAMENT S93.421D SPRAIN DELTOID LIGAMENT RIGHT ANKLE.  The various methods of treatment have been discussed with the patient.  No guarantees were given.  After consideration of risks, benefits and other options for treatment, the patient has consented to surgery.  I have reviewed the patients' chart and labs.     A history and physical examination was performed in my office.  The patient was reexamined.  There have been no changes to this history and physical examination.  Wesley Cortez A

## 2020-05-14 NOTE — Op Note (Signed)
Operative note   Surgeon:Ausencio Vaden [employer] logistics/support/administrative officer: None    Preop diagnosis: 1.  Peroneal brevis split tendon tear right ankle 2.  Peroneal longus tenosynovitis right ankle 3.  Lateral ankle instability right ankle 4.  Deltoid ligament tear right medial ankle 5.  Ankle synovitis and inflammation    Postop diagnosis: Same    Procedure: 1.  Right ankle arthroscopy with debridement 2.  Deltoid ligament debridement with repair 3.  Peroneal brevis tendon tear repair right ankle 4.  Debridement peroneus longus tendon right ankle 5.  Brostrm Gould lateral ankle stabilization right ankle    EBL: Minimal    Anesthesia:regional and general patient had a popliteal block in the preoperative holding area    Hemostasis: Thigh tourniquet inflated to 250 mmHg for approximately 100 minutes    Specimen: None    Complications: None    Operative indications:[employer]. is an 39 y.o. that presents today for surgical intervention.  The risks/benefits/alternatives/complications have been discussed and consent has been given.    Procedure:  Patient was brought into the OR and placed on the operating table in thesupine position. After anesthesia was obtained theright lower extremity was prepped and draped in usual sterile fashion.  Attention was directed to     Patient tolerated the procedure and anesthesia well.  Was transported from tthe anterior aspect of the right ankle where the anteromedial and lateral portals were placed.  Sharp dissection was carried down through the skin and blunt dissection to the capsule.  The joint was then entered and the arthroscopy equipment was entered.  The ankle joint was evaluated.  There was noted to be some mild fibrotic and scar tissue within the anterior aspect of the ankle joint.  No large impingement lesions.  The cartilage itself looked to be intact.  Debridement and excision of the synovitis and inflammatory tissue was then performed with a small shaver.   After further inspection the ankle joint was colonoscopy equipment was removed.  Attention was then directed to the medial aspect of the ankle where a medial incision was performed overlying the deltoid region.  Sharp and blunt dissection carried down to the level of the deltoid ligament.  Care was taken to retract the vascular structures.  He did have fairly large tortuous veins in this area.  These were all retracted throughout the entire procedure.  At this time inspection of the deltoid ligament appear to be intact with only mild inflammatory areas within the ligament itself.  The Topaz wand was then used to infiltrate the ligament at this time for assistance and repair and healing of the ligament.  This wound was then flushed with copious amounts of irrigation.  Closure was performed with a 4-0 Vicryl and later a 3-0 nylon.  Attention was directed to the lateral aspect of the right ankle where an incision was placed course along the posterior aspect of the fibula to the level just proximal to the fifth metatarsal base.  Sharp and blunt dissection was carried down to the peroneal tendon sheath.  The sheath was then entered.  At this time inspection of the peroneal tendon revealed a small split thickness tear within the peroneal brevis tendon.  The peroneal longus tendon had a fair amount of tenosynovitis surrounding the tendon itself.  Deep to the tendons there was a small accessory peroneal tendon.  There was a very low-lying muscle belly within the fibular groove and just distal to the fibula.  The accessory peroneal  tendon was excised and retracted and reflected proximal.  The low-lying peroneal muscle was then excised sharply.  The peroneal brevis tear was excised from the area.  This was then repaired with a 4-0 Vicryl.  Both the peroneal longus and brevis were then infiltrated with the Topaz wand.  The tenosynovitis was excised from the peroneal longus region.  The peroneal tendon sheath was then  repaired.  The peroneal retinaculum was repaired against the fibula with a 2-0 Ethibond with a pants over vest fashion.  The tendon sheath was repaired with a 3-0 Vicryl.  Attention was then directed to the anterolateral aspect of the joint where blunt dissection was carried down to the extensor retinaculum.  This was reflected distally.  At this time the antitalofibular ligament was incised.  With a 2-0 Ethibond in a pants over vest fashion this was then repaired and stabilized in a tighter position.  Good stability was noted.  The extensor retinaculum was then reflected proximally over the ligament repair and stabilized with a 3-0 Vicryl.  Final closure was then obtained of the skin with a 3-0 Vicryl and the skin finally was closed with a 4-0 Monocryl.  The remaining incision sites were closed with 3-0 nylon's.  All areas were infiltrated with a total of 10 cc of 0.25% bupivacaine.  A bulky sterile dressing was applied and the patient was then placed in an equalizer walker boot with the foot at a neutral 90 degree position.  The patient was taken from the OR to the PACU with all vital signs stable and vascular status intact. To be discharged per routine protocol.  Will follow up in approximately 1 week in the outpatient clinic.

## 2020-05-14 NOTE — Anesthesia Procedure Notes (Addendum)
Anesthesia Regional Block: Adductor canal block   Pre-Anesthetic Checklist: ,, timeout performed, Correct Patient, Correct Site, Correct Laterality, Correct Procedure, Correct Position, site marked, Risks and benefits discussed,  Surgical consent,  Pre-op evaluation,  At surgeon's request and post-op pain management  Laterality: Right  Prep: chloraprep       Needles:  Injection technique: Single-shot  Needle Type: Echogenic Needle     Needle Length: 9cm  Needle Gauge: 21     Additional Needles:   Procedures:,,,, ultrasound used (permanent image in chart),,,,  Narrative:  Start time: 05/14/2020 11:21 AM End time: 05/14/2020 11:30 AM Injection made incrementally with aspirations every 5 mL.  Performed by: Personally   Additional Notes: Functioning IV was confirmed and monitors applied. Ultrasound guidance: relevant anatomy identified, needle position confirmed, local anesthetic spread visualized around nerve(s)., vascular puncture avoided.  Image printed for medical record.  Negative aspiration and no paresthesias; incremental administration of local anesthetic. The patient tolerated the procedure well. Vitals signes recorded in RN notes.  10 ml

## 2020-05-14 NOTE — Anesthesia Preprocedure Evaluation (Signed)
Anesthesia Evaluation  Patient identified by MRN, date of birth, ID band Patient awake    Reviewed: Allergy & Precautions, H&P , NPO status , Patient's Chart, lab work & pertinent test results  Airway Mallampati: II  TM Distance: >3 FB Neck ROM: full    Dental no notable dental hx.    Pulmonary    Pulmonary exam normal breath sounds clear to auscultation       Cardiovascular hypertension, Normal cardiovascular exam Rhythm:regular Rate:Normal     Neuro/Psych    GI/Hepatic   Endo/Other  Hypothyroidism   Renal/GU      Musculoskeletal   Abdominal   Peds  Hematology   Anesthesia Other Findings   Reproductive/Obstetrics                             Anesthesia Physical Anesthesia Plan  ASA: II  Anesthesia Plan: General LMA   Post-op Pain Management:  Regional for Post-op pain   Induction:   PONV Risk Score and Plan: 2 and Treatment may vary due to age or medical condition, Ondansetron and Dexamethasone  Airway Management Planned:   Additional Equipment:   Intra-op Plan:   Post-operative Plan:   Informed Consent: I have reviewed the patients History and Physical, chart, labs and discussed the procedure including the risks, benefits and alternatives for the proposed anesthesia with the patient or authorized representative who has indicated his/her understanding and acceptance.     Dental Advisory Given  Plan Discussed with: CRNA  Anesthesia Plan Comments:         Anesthesia Quick Evaluation

## 2020-05-14 NOTE — Anesthesia Postprocedure Evaluation (Signed)
Anesthesia Post Note  Patient: Wesley Cortez.  Procedure(s) Performed: BROSTRUM-GOULD RIGHT (Right Ankle) A-SCOPE/DEBRIDEMENT;EXTENSIVE RIGHT (Right Ankle) FLEXOR TENDON REPAIR - SECONDARY (Right Ankle)     Patient location during evaluation: PACU Anesthesia Type: General Level of consciousness: awake and alert and oriented Pain management: satisfactory to patient Vital Signs Assessment: post-procedure vital signs reviewed and stable Respiratory status: spontaneous breathing, nonlabored ventilation and respiratory function stable Cardiovascular status: blood pressure returned to baseline and stable Postop Assessment: Adequate PO intake and No signs of nausea or vomiting Anesthetic complications: no   No complications documented.  Cherly Beach

## 2020-05-14 NOTE — Transfer of Care (Signed)
Immediate Anesthesia Transfer of Care Note  Patient: Wesley Cortez.  Procedure(s) Performed: BROSTRUM-GOULD RIGHT (Right Ankle) A-SCOPE/DEBRIDEMENT;EXTENSIVE RIGHT (Right Ankle) FLEXOR TENDON REPAIR - SECONDARY (Right Ankle)  Patient Location: PACU  Anesthesia Type: General LMA  Level of Consciousness: awake, alert  and patient cooperative  Airway and Oxygen Therapy: Patient Spontanous Breathing and Patient connected to supplemental oxygen  Post-op Assessment: Post-op Vital signs reviewed, Patient's Cardiovascular Status Stable, Respiratory Function Stable, Patent Airway and No signs of Nausea or vomiting  Post-op Vital Signs: Reviewed and stable  Complications: No complications documented.

## 2020-05-15 ENCOUNTER — Encounter: Payer: Self-pay | Admitting: Podiatry

## 2020-05-19 ENCOUNTER — Encounter: Payer: Self-pay | Admitting: Podiatry

## 2020-05-20 ENCOUNTER — Encounter: Payer: Self-pay | Admitting: Emergency Medicine

## 2020-05-20 ENCOUNTER — Ambulatory Visit
Admission: EM | Admit: 2020-05-20 | Discharge: 2020-05-20 | Disposition: A | Discharge status: Home / Self Care | Payer: BLUE CROSS/BLUE SHIELD | Attending: Family Medicine | Admitting: Family Medicine

## 2020-05-20 ENCOUNTER — Other Ambulatory Visit: Payer: Self-pay

## 2020-05-20 DIAGNOSIS — J029 Acute pharyngitis, unspecified: Secondary | ICD-10-CM

## 2020-05-20 DIAGNOSIS — Z20822 Contact with and (suspected) exposure to covid-19: Secondary | ICD-10-CM | POA: Diagnosis not present

## 2020-05-20 LAB — SARS CORONAVIRUS 2 (TAT 6-24 HRS): SARS Coronavirus 2: NEGATIVE

## 2020-05-20 NOTE — Discharge Instructions (Addendum)
You were seen at the Urgent Care after a COVID exposure. Please pick up your prescriptions at your pharmacy. Be sure to stay hydrated. Quarantine as discussed.  Follow up with your PCP as needed.   If you haven't already, sign up for My Chart to have easy access to your labs results, and communication with your primary care physician.  Dr. Rachael Darby

## 2020-05-20 NOTE — ED Triage Notes (Signed)
Patient in today c/o sore throat and nasal congestion x 2 days. Patient's daughter tested positive on 05/16/20. Patient requesting covid test and denies strep test.

## 2020-05-20 NOTE — ED Provider Notes (Signed)
[facility]    CSN: [id number] Arrival date & time: 05/20/20  1111      History   Chief Complaint Chief Complaint  Patient presents with  . Sore Throat  . Nasal Congestion  . Covid Exposure    HPI Wesley Cortez. is a 39 y.o. male.   HPI   Surgery last Wednesday for torn ligament and tendon in right ankle. Was intubated for procedure. Sore throat started on Thursday. No OTC meds for sore throat. Daughter (74 yo)  at home with patient tested positive for COVID on 05/16/20. Pt is partially vaccinated against COVID and is due for next shot on 05/23/20.  Patient tested poisitive for COVID back in March/April 2021.  Endorses sore throat and congestion. Denies fever, myalgias, nausea, vomiting, diarrhea, changes in taste and smell, shortness of breath and chest pain.    Past Medical History:  Diagnosis Date  . COVID-19 11/2019  . Hypertension   . Hypothyroidism   . Thyroid disease     There are no problems to display for this patient.   Past Surgical History:  Procedure Laterality Date  . ANKLE ARTHROSCOPY Right 05/14/2020   Procedure: A-SCOPE/DEBRIDEMENT;EXTENSIVE RIGHT;  Surgeon: Gwyneth Revels, DPM;  Location: [facility];  Service: Podiatry;  Laterality: Right;  . ANKLE RECONSTRUCTION Right 05/14/2020   Procedure: BROSTRUM-GOULD RIGHT;  Surgeon: Gwyneth Revels, DPM;  Location: [facility];  Service: Podiatry;  Laterality: Right;  General with Popliteal  . HERNIA REPAIR     x3  . KNEE SURGERY Left   . TENDON REPAIR Right 05/14/2020   Procedure: FLEXOR TENDON REPAIR - SECONDARY;  Surgeon: Gwyneth Revels, DPM;  Location: [facility];  Service: Podiatry;  Laterality: Right;       Home Medications    Prior to Admission medications   Medication Sig Start Date End Date Taking? Authorizing Provider  amphetamine-dextroamphetamine (ADDERALL XR) 15 MG 24 hr capsule TK ONE C PO QD 03/28/19  Yes [provider]    amphetamine-dextroamphetamine (ADDERALL) 5 MG tablet Take 5 mg by mouth daily as needed.   Yes [provider]  diltiazem (TIAZAC) 240 MG 24 hr capsule Take 240 mg by mouth daily.   Yes [provider]  DULoxetine (CYMBALTA) 60 MG capsule Take 60 mg by mouth daily. 12/20/19  Yes [provider]  gabapentin (NEURONTIN) 300 MG capsule TK 1 TO 3 CS PO QHS 04/12/19  Yes [provider]  hydrochlorothiazide (HYDRODIURIL) 25 MG tablet Take 25 mg by mouth daily.   Yes [provider]  levothyroxine (SYNTHROID, LEVOTHROID) 25 MCG tablet Take 25 mcg by mouth daily before breakfast.   Yes [provider]  LORazepam (ATIVAN) 1 MG tablet Take 1 mg by mouth 3 (three) times daily as needed for anxiety.   Yes [provider]  MELATONIN PO Take by mouth at bedtime as needed.   Yes [provider]  oxyCODONE-acetaminophen (PERCOCET) 5-325 MG tablet Take 1-2 tablets by mouth every 6 (six) hours as needed for severe pain. 05/14/20 05/14/21 Yes Gwyneth Revels, DPM  prazosin (MINIPRESS) 1 MG capsule Take 1 mg by mouth 2 (two) times daily.   Yes [provider]  benzonatate (TESSALON) 100 MG capsule Take 1-2 capsules (100-200 mg total) by mouth 3 (three) times daily as needed. Patient not taking: Reported on 05/12/2020 12/31/19   Wallis Bamberg, PA-C  albuterol (VENTOLIN HFA) 108 (90 Base) MCG/ACT inhaler Inhale 2 puffs into the lungs every 6 (six)  hours as needed for wheezing. 07/11/19 08/22/19  Renford Dills, NP  escitalopram (LEXAPRO) 10 MG tablet TK 1 T PO QD 04/12/19 08/22/19  [provider]    Family History Family History  Problem Relation Age of Onset  . Allergies Mother   . Diabetes Mother        diet controlled  . Hypertension Mother   . Heart attack Father 34  . Lung cancer Father   . Hypertension Father   . Hyperlipidemia Father     Social History Social History   Tobacco Use  . Smoking status: Never Smoker  .  Smokeless tobacco: Current User    Types: Chew  Vaping Use  . Vaping Use: Never used  Substance Use Topics  . Alcohol use: Yes    Comment: occassional  . Drug use: Never     Allergies   No known allergies   Review of Systems Review of Systems  Constitutional: Negative for chills and fever.  HENT: Positive for congestion and sore throat.   Respiratory: Negative for cough and shortness of breath.   Cardiovascular: Negative for chest pain.  Gastrointestinal: Negative for diarrhea, nausea and vomiting.  Musculoskeletal: Negative for myalgias.  Neurological: Negative for headaches.  All other systems reviewed and are negative.    Physical Exam Triage Vital Signs ED Triage Vitals  Enc Vitals Group     BP 05/20/20 1247 124/89     Pulse Rate 05/20/20 1247 98     Resp 05/20/20 1247 18     Temp 05/20/20 1247 97.6 F (36.4 C)     Temp Source 05/20/20 1247 Oral     SpO2 05/20/20 1247 98 %     Weight 05/20/20 1250 270 lb (122.5 kg)     Height 05/20/20 1250 6\' 4"  (1.93 m)     Head Circumference --      Peak Flow --      Pain Score 05/20/20 1249 7     Pain Loc --      Pain Edu? --      Excl. in GC? --    No data found.  Updated Vital Signs BP 124/89 (BP Location: Left Arm)   Pulse 98   Temp 97.6 F (36.4 C) (Oral)   Resp 18   Ht 6\' 4"  (1.93 m)   Wt 270 lb (122.5 kg)   SpO2 98%   BMI 32.87 kg/m   Visual Acuity Right Eye Distance:   Left Eye Distance:   Bilateral Distance:    Right Eye Near:   Left Eye Near:    Bilateral Near:     Physical Exam Vitals and nursing note reviewed.  Constitutional:      General: He is not in acute distress.    Appearance: He is well-developed. He is not ill-appearing.  HENT:     Head: Normocephalic and atraumatic.     Right Ear: There is impacted cerumen.     Left Ear: There is impacted cerumen.     Nose: No congestion.     Mouth/Throat:     Mouth: Mucous membranes are moist. No oral lesions.     Pharynx: Oropharynx is  clear. Uvula midline.     Tonsils: No tonsillar exudate or tonsillar abscesses.  Eyes:     Conjunctiva/sclera: Conjunctivae normal.     Pupils: Pupils are equal, round, and reactive to light.  Neck:     Thyroid: No thyromegaly.  Cardiovascular:     Rate and Rhythm: Normal rate  and regular rhythm.     Heart sounds: Normal heart sounds. No murmur heard.   Pulmonary:     Effort: Pulmonary effort is normal. No respiratory distress.     Breath sounds: Normal breath sounds.  Musculoskeletal:     Cervical back: Normal range of motion and neck supple.  Lymphadenopathy:     Cervical: No cervical adenopathy.  Skin:    General: Skin is warm and dry.     Capillary Refill: Capillary refill takes less than 2 seconds.  Neurological:     Mental Status: He is alert and oriented to person, place, and time.  Psychiatric:        Mood and Affect: Mood normal.        Behavior: Behavior normal.      UC Treatments / Results  Labs (all labs ordered are listed, but only abnormal results are displayed) Labs Reviewed  SARS CORONAVIRUS 2 (TAT 6-24 HRS)    EKG   Radiology No results found.  Procedures Procedures (including critical care time)  Medications Ordered in UC Medications - No data to display  Initial Impression / Assessment and Plan / UC Course  I have reviewed the triage vital signs and the nursing notes.  Pertinent labs & imaging results that were available during my care of the patient were reviewed by me and considered in my medical decision making (see chart for details).     Patient with covid exposure. Patient to quarantine with family. Overall pt is well appearing, well hydrated, without respiratory distress. Discussed symptomatic treatment. COVID test pending.  - continue to monitor for fevers  - continue Tylenol/ Motrin as needed for discomfort - nasal saline to help with his nasal congestion - Use a cool mist humidifier at bedtime to help with breathing - Stressed  hydration - Discussed ED precautions, understanding voiced  Final Clinical Impressions(s) / UC Diagnoses   Final diagnoses:  None     Discharge Instructions     You were seen at the Urgent Care after a COVID exposure. Please pick up your prescriptions at your pharmacy. Be sure to stay hydrated.  Follow up with your PCP as needed.   If you haven't already, sign up for My Chart to have easy access to your labs results, and communication with your primary care physician.  Dr. Rachael Darby     ED Prescriptions    None     PDMP not reviewed this encounter.   Katha Cabal, DO 05/20/20 1341

## 2020-11-02 IMAGING — CR DG CHEST 2V
2 series · 2 of 2 positions shown · non-contrast
Comparison: Chest x-ray dated July 11, 2019.

CLINICAL DATA: Fever, cough, and body aches.  COVID exposure.

EXAM:
CHEST - 2 VIEW

[chest pa]
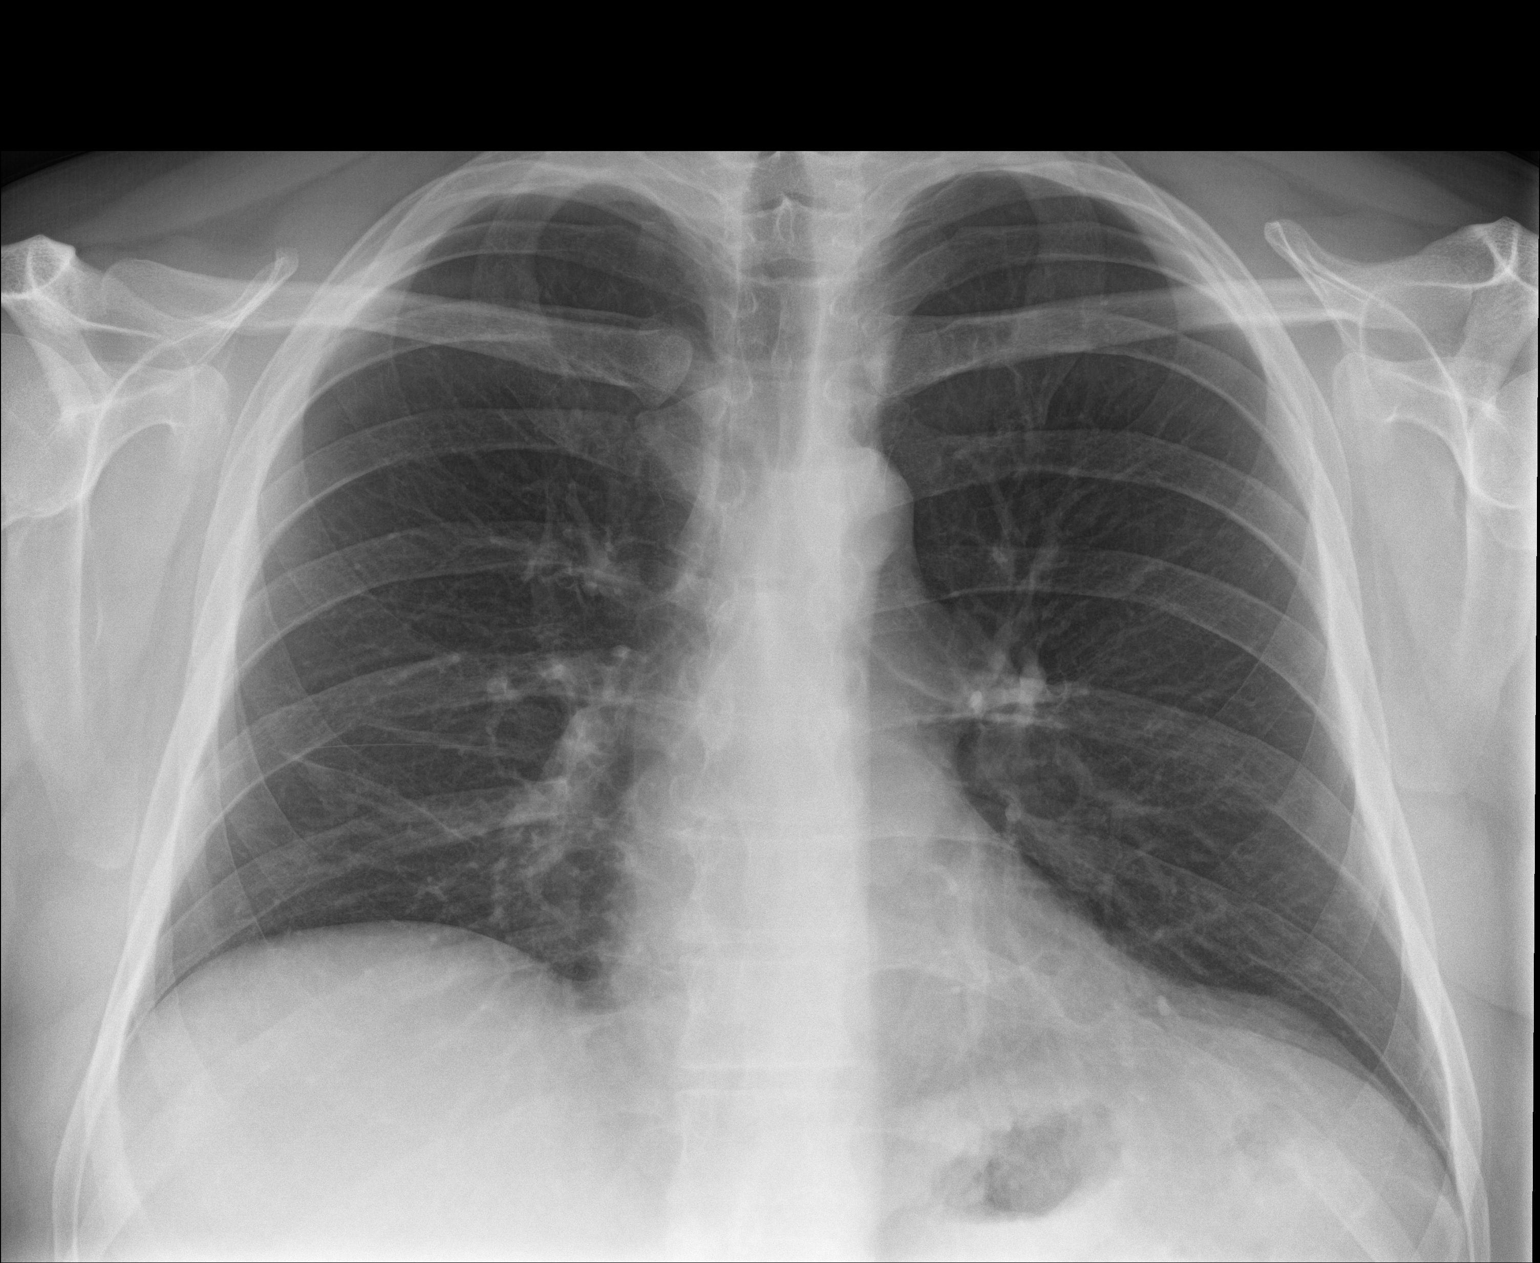

[chest lat]
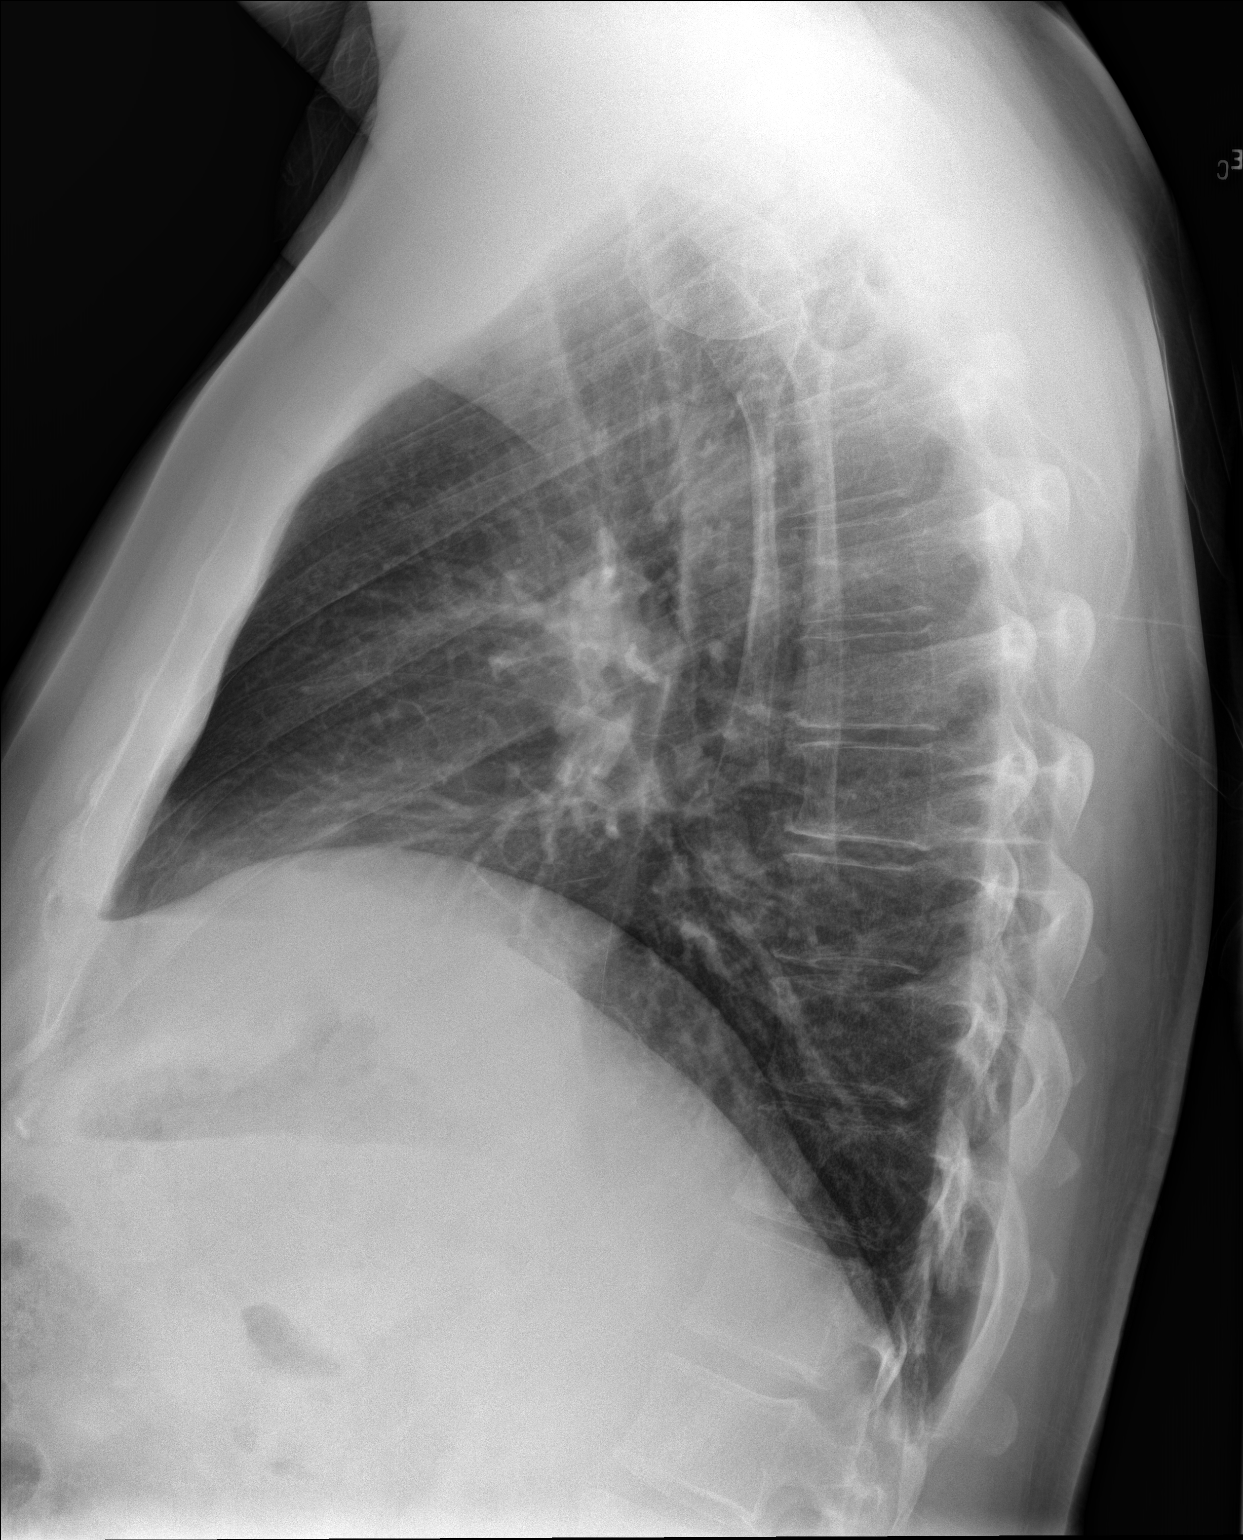

[2 of 2 positions shown; findings below may reference images not displayed]

FINDINGS: The heart size and mediastinal contours are within normal limits.
Both lungs are clear. The visualized skeletal structures are
unremarkable.
IMPRESSION: No active cardiopulmonary disease.

## 2020-12-26 ENCOUNTER — Ambulatory Visit: Payer: Self-pay

## 2020-12-26 ENCOUNTER — Ambulatory Visit
Admission: EM | Admit: 2020-12-26 | Discharge: 2020-12-26 | Disposition: A | Discharge status: Home / Self Care | Payer: BLUE CROSS/BLUE SHIELD | Attending: Sports Medicine | Admitting: Sports Medicine

## 2020-12-26 ENCOUNTER — Other Ambulatory Visit: Payer: Self-pay

## 2020-12-26 ENCOUNTER — Encounter: Payer: Self-pay | Admitting: Emergency Medicine

## 2020-12-26 ENCOUNTER — Ambulatory Visit (INDEPENDENT_AMBULATORY_CARE_PROVIDER_SITE_OTHER): Payer: BLUE CROSS/BLUE SHIELD

## 2020-12-26 DIAGNOSIS — R0981 Nasal congestion: Secondary | ICD-10-CM | POA: Diagnosis not present

## 2020-12-26 DIAGNOSIS — R059 Cough, unspecified: Secondary | ICD-10-CM

## 2020-12-26 DIAGNOSIS — Z79899 Other long term (current) drug therapy: Secondary | ICD-10-CM | POA: Diagnosis not present

## 2020-12-26 DIAGNOSIS — R000 Tachycardia, unspecified: Secondary | ICD-10-CM

## 2020-12-26 DIAGNOSIS — R61 Generalized hyperhidrosis: Secondary | ICD-10-CM | POA: Insufficient documentation

## 2020-12-26 DIAGNOSIS — Z20822 Contact with and (suspected) exposure to covid-19: Secondary | ICD-10-CM | POA: Diagnosis not present

## 2020-12-26 DIAGNOSIS — R0602 Shortness of breath: Secondary | ICD-10-CM | POA: Insufficient documentation

## 2020-12-26 DIAGNOSIS — J069 Acute upper respiratory infection, unspecified: Secondary | ICD-10-CM | POA: Diagnosis not present

## 2020-12-26 DIAGNOSIS — Z8616 Personal history of COVID-19: Secondary | ICD-10-CM | POA: Diagnosis not present

## 2020-12-26 LAB — RESP PANEL BY RT-PCR (FLU A&B, COVID) ARPGX2
Influenza A by PCR: NEGATIVE
Influenza B by PCR: NEGATIVE
SARS Coronavirus 2 by RT PCR: NEGATIVE

## 2020-12-26 MED ORDER — ALBUTEROL SULFATE HFA 108 (90 BASE) MCG/ACT IN AERS: 1.0000 | INHALATION_SPRAY | Freq: Four times a day (QID) | RESPIRATORY_TRACT | 0 refills | Status: AC | PRN

## 2020-12-26 MED ORDER — CETIRIZINE-PSEUDOEPHEDRINE ER 5-120 MG PO TB12: 1.0000 | ORAL_TABLET | Freq: Every day | ORAL | 0 refills | Status: AC

## 2020-12-26 MED ORDER — FLUTICASONE PROPIONATE 50 MCG/ACT NA SUSP: 2.0000 | Freq: Every day | NASAL | 0 refills | Status: DC

## 2020-12-26 MED ORDER — BENZONATATE 100 MG PO CAPS: 200.0000 mg | ORAL_CAPSULE | Freq: Three times a day (TID) | ORAL | 0 refills | Status: DC | PRN

## 2020-12-26 NOTE — Discharge Instructions (Addendum)
Please see educational handouts 

## 2020-12-26 NOTE — ED Provider Notes (Signed)
[facility]    CSN: [id number] Arrival date & time: 12/26/20  1400      History   Chief Complaint Chief Complaint  Patient presents with  . Cough  . Shortness of Breath    HPI Wesley Cortez. is a 40 y.o. male.   Patient is a pleasant 40 year old male who presents for evaluation of the above issues.  Normally sees Dr. Maryjane Hurter at [facility] here in [place].  Unfortunately he was unable to get an appointment today.  He reports 3 days of symptoms including cough, shortness of breath, difficulty breathing with increased work of breathing, nasal congestion, rhinorrhea, and a lot of sweating.  No documented fevers but he feels warm.  He has no history of seasonal allergies.  He is a non-smoker.  No COPD or asthma.  No recent travel or leg swelling or pain.  No history of DVT or PE.  He denies chest pain but he does have some chest tightness.  He was started on CPAP about a month ago and he claims that he is cleaning the machine and the tubing appropriately.  He denies any abdominal pain or urinary symptoms.  No significant sore throat or ear pain.  No red flag signs or symptoms elicited on history.     Past Medical History:  Diagnosis Date  . COVID-19 11/2019  . Hypertension   . Hypothyroidism   . Thyroid disease     There are no problems to display for this patient.   Past Surgical History:  Procedure Laterality Date  . ANKLE ARTHROSCOPY Right 05/14/2020   Procedure: A-SCOPE/DEBRIDEMENT;EXTENSIVE RIGHT;  Surgeon: Gwyneth Revels, DPM;  Location: [facility];  Service: Podiatry;  Laterality: Right;  . ANKLE RECONSTRUCTION Right 05/14/2020   Procedure: BROSTRUM-GOULD RIGHT;  Surgeon: Gwyneth Revels, DPM;  Location: [facility];  Service: Podiatry;  Laterality: Right;  General with Popliteal  . HERNIA REPAIR     x3  . KNEE SURGERY Left   . TENDON REPAIR Right 05/14/2020   Procedure: FLEXOR TENDON REPAIR - SECONDARY;  Surgeon: Gwyneth Revels,  DPM;  Location: [facility];  Service: Podiatry;  Laterality: Right;       Home Medications    Prior to Admission medications   Medication Sig Start Date End Date Taking? Authorizing Provider  albuterol (VENTOLIN HFA) 108 (90 Base) MCG/ACT inhaler Inhale 1-2 puffs into the lungs every 6 (six) hours as needed for shortness of breath. 12/26/20  Yes Delton See, MD  amphetamine-dextroamphetamine (ADDERALL XR) 15 MG 24 hr capsule TK ONE C PO QD 03/28/19  Yes [provider]  cetirizine-pseudoephedrine (ZYRTEC-D) 5-120 MG tablet Take 1 tablet by mouth daily. 12/26/20  Yes Delton See, MD  diltiazem [facility]) 240 MG 24 hr capsule Take 240 mg by mouth daily.   Yes [provider]  DULoxetine (CYMBALTA) 60 MG capsule Take 60 mg by mouth daily. 12/20/19  Yes [provider]  fluticasone (FLONASE) 50 MCG/ACT nasal spray Place 2 sprays into both nostrils daily. 12/26/20  Yes Delton See, MD  gabapentin (NEURONTIN) 300 MG capsule TK 1 TO 3 CS PO QHS 04/12/19  Yes [provider]  hydrochlorothiazide (HYDRODIURIL) 25 MG tablet Take 25 mg by mouth daily.   Yes [provider]  levothyroxine (SYNTHROID, LEVOTHROID) 25 MCG tablet Take 25 mcg by mouth daily before breakfast.   Yes [provider]  LORazepam (ATIVAN) 1 MG tablet Take 1 mg by mouth 3 (three) times daily as needed  for anxiety.   Yes [provider]  MELATONIN PO Take by mouth at bedtime as needed.   Yes [provider]  prazosin (MINIPRESS) 1 MG capsule Take 1 mg by mouth 2 (two) times daily.   Yes [provider]  amphetamine-dextroamphetamine (ADDERALL) 5 MG tablet Take 5 mg by mouth daily as needed.    [provider]  benzonatate (TESSALON) 100 MG capsule Take 2 capsules (200 mg total) by mouth 3 (three) times daily as needed. 12/26/20   Delton See, MD  oxyCODONE-acetaminophen (PERCOCET) 5-325 MG tablet Take 1-2 tablets by mouth every 6  (six) hours as needed for severe pain. 05/14/20 05/14/21  Gwyneth Revels, DPM  escitalopram (LEXAPRO) 10 MG tablet TK 1 T PO QD 04/12/19 08/22/19  [provider]    Family History Family History  Problem Relation Age of Onset  . Allergies Mother   . Diabetes Mother        diet controlled  . Hypertension Mother   . Heart attack Father 28  . Lung cancer Father   . Hypertension Father   . Hyperlipidemia Father     Social History Social History   Tobacco Use  . Smoking status: Never Smoker  . Smokeless tobacco: Current User    Types: Chew  Vaping Use  . Vaping Use: Never used  Substance Use Topics  . Alcohol use: Yes    Comment: occassional  . Drug use: Never     Allergies   No known allergies   Review of Systems Review of Systems  Constitutional: Positive for activity change and diaphoresis. Negative for appetite change, chills, fatigue and fever.  HENT: Positive for congestion and rhinorrhea. Negative for ear discharge, ear pain, postnasal drip, sinus pressure, sinus pain, sneezing and sore throat.   Eyes: Negative.  Negative for photophobia, pain and visual disturbance.  Respiratory: Positive for cough, chest tightness and shortness of breath. Negative for wheezing and stridor.   Cardiovascular: Negative for chest pain, palpitations and leg swelling.  Gastrointestinal: Negative.  Negative for abdominal pain, constipation, diarrhea, nausea and vomiting.  Genitourinary: Negative.  Negative for dysuria, flank pain, frequency, hematuria and urgency.  Musculoskeletal: Negative.  Negative for arthralgias and myalgias.  Skin: Negative.  Negative for color change, pallor, rash and wound.  Neurological: Negative.  Negative for dizziness, seizures, syncope, light-headedness and numbness.  All other systems reviewed and are negative.    Physical Exam Triage Vital Signs ED Triage Vitals  Enc Vitals Group     BP 12/26/20 1423 (!) 155/98     Pulse Rate 12/26/20 1423 (!)  122     Resp 12/26/20 1423 20     Temp 12/26/20 1423 98.4 F (36.9 C)     Temp Source 12/26/20 1423 Oral     SpO2 12/26/20 1423 98 %     Weight 12/26/20 1420 270 lb 1 oz (122.5 kg)     Height 12/26/20 1420 6\' 4"  (1.93 m)     Head Circumference --      Peak Flow --      Pain Score 12/26/20 1420 4     Pain Loc --      Pain Edu? --      Excl. in GC? --    No data found.  Updated Vital Signs BP (!) 155/98 (BP Location: Right Arm)   Pulse (!) 122   Temp 98.4 F (36.9 C) (Oral)   Resp 20   Ht 6\' 4"  (1.93 m)  Wt 122.5 kg   SpO2 98%   BMI 32.87 kg/m   Visual Acuity Right Eye Distance:   Left Eye Distance:   Bilateral Distance:    Right Eye Near:   Left Eye Near:    Bilateral Near:     Physical Exam Vitals and nursing note reviewed.  Constitutional:      General: He is not in acute distress.    Appearance: He is well-developed. He is not ill-appearing, toxic-appearing or diaphoretic.  HENT:     Head: Normocephalic and atraumatic.     Right Ear: Tympanic membrane normal.     Left Ear: Tympanic membrane normal.     Nose: Congestion present. No rhinorrhea.     Mouth/Throat:     Mouth: Mucous membranes are moist.     Pharynx: Posterior oropharyngeal erythema present. No pharyngeal swelling or oropharyngeal exudate.  Eyes:     General: No scleral icterus.       Right eye: No discharge.        Left eye: No discharge.     Extraocular Movements: Extraocular movements intact.     Conjunctiva/sclera: Conjunctivae normal.     Pupils: Pupils are equal, round, and reactive to light.  Neck:     Vascular: No JVD.     Trachea: No tracheal deviation.  Cardiovascular:     Rate and Rhythm: Regular rhythm. Tachycardia present.  No extrasystoles are present.    Pulses: Normal pulses. No decreased pulses.     Heart sounds: Normal heart sounds. No murmur heard. No friction rub. No gallop.   Pulmonary:     Effort: Pulmonary effort is normal. No tachypnea or respiratory distress.      Breath sounds: Normal breath sounds. No stridor. No decreased breath sounds, wheezing, rhonchi or rales.  Musculoskeletal:     Cervical back: Normal range of motion and neck supple.  Lymphadenopathy:     Cervical: Cervical adenopathy present.  Skin:    General: Skin is warm and dry.     Capillary Refill: Capillary refill takes less than 2 seconds.     Coloration: Skin is not cyanotic.     Findings: No erythema or rash.  Neurological:     General: No focal deficit present.     Mental Status: He is alert and oriented to person, place, and time.      UC Treatments / Results  Labs (all labs ordered are listed, but only abnormal results are displayed) Labs Reviewed  RESP PANEL BY RT-PCR (FLU A&B, COVID) [id number]    EKG   Radiology DG Chest 2 View  Result Date: 12/26/2020 CLINICAL DATA:  Cough. Diaphoresis, Tachypnea. Symptoms started 3 days ago. EXAM: CHEST - 2 VIEW COMPARISON:  01/01/2020 FINDINGS: The heart size and mediastinal contours are within normal limits. Both lungs are clear. The visualized skeletal structures are unremarkable. IMPRESSION: No active cardiopulmonary disease. Electronically Signed   By: Norva Pavlov M.D.   On: 12/26/2020 15:23    Procedures Procedures (including critical care time)  Medications Ordered in UC Medications - No data to display  Initial Impression / Assessment and Plan / UC Course  I have reviewed the triage vital signs and the nursing notes.  Pertinent labs & imaging results that were available during my care of the patient were reviewed by me and considered in my medical decision making (see chart for details).  Clinical impression: 3 days of cough, shortness of breath, diaphoresis, congestion, rhinorrhea, and tachycardia on examination today.  Treatment  plan: 1.  The findings and treatment plan were discussed in detail with the patient.  Patient was in agreement. 2.  I recommended getting a respiratory panel.  COVID test was  negative, influenza A was negative, influenza B was negative. 3.  Recommend getting a chest x-ray.  The results are above.  No active cardiopulmonary disease. 4.  We discussed doing labs, but the patient said that he was getting a ride and that he had to leave.  I was concerned because he was tachycardic and although not truly tachypneic he was breathing 20/min and I wanted to do further work-up including labs.  Patient declined and said that if he was having difficulty he would come back tomorrow or go to the emergency room. 5.  Educational handouts provided. 6.  I prescribed Tessalon Perles, Flonase, Zyrtec-D, and albuterol.  I asked him not to use the albuterol if his heart was racing though.  He voiced verbal understanding. 7.  Plenty of rest, plenty of fluids, Tylenol or Motrin for any fever discomfort. 8.  As we discussed he will come back tomorrow if he is still symptomatic.  If he worsens he will go to the ER.  I do want him to follow-up with his primary care provider next week if his symptoms persist. 9.  He was stable when he was discharged.  He will follow-up with us as needed.    Final Clinical Impressions(s) / UC Diagnoses   Final diagnoses:  Viral URI with cough  Shortness of breath  Diaphoresis  Nasal congestion     Discharge Instructions     Please see educational handouts.   ED Prescriptions    Medication Sig Dispense Auth. Provider   benzonatate (TESSALON) 100 MG capsule Take 2 capsules (200 mg total) by mouth 3 (three) times daily as needed. 30 capsule Delton SeeBarnes, Ethelbert Thain, MD   fluticasone [facility](FLONASE) 50 MCG/ACT nasal spray Place 2 sprays into both nostrils daily. 15.8 mL Delton SeeBarnes, Dyshaun Bonzo, MD   cetirizine-pseudoephedrine (ZYRTEC-D) 5-120 MG tablet Take 1 tablet by mouth daily. 30 tablet Delton SeeBarnes, Hooper Petteway, MD   albuterol (VENTOLIN HFA) 108 (90 Base) MCG/ACT inhaler Inhale 1-2 puffs into the lungs every 6 (six) hours as needed for shortness of breath. 1 each Delton SeeBarnes, Otillia Cordone, MD      PDMP not reviewed this encounter.   Delton SeeBarnes, Fiorela Pelzer, MD 12/26/20 2013

## 2020-12-26 NOTE — ED Triage Notes (Signed)
Pt c/o cough, shortness of breath, sweats, nasal congestion. Started about 3 days ago. Denies fever.

## 2022-08-18 ENCOUNTER — Ambulatory Visit: Payer: Self-pay

## 2022-08-18 ENCOUNTER — Ambulatory Visit
Admission: EM | Admit: 2022-08-18 | Discharge: 2022-08-18 | Disposition: A | Discharge status: Home / Self Care | Payer: Self-pay | Attending: Emergency Medicine | Admitting: Emergency Medicine

## 2022-08-18 DIAGNOSIS — J069 Acute upper respiratory infection, unspecified: Secondary | ICD-10-CM

## 2022-08-18 MED ORDER — PREDNISONE 20 MG PO TABS: 40.0000 mg | ORAL_TABLET | Freq: Every day | ORAL | 0 refills | Status: DC

## 2022-08-18 MED ORDER — PROMETHAZINE-DM 6.25-15 MG/5ML PO SYRP: 5.0000 mL | ORAL_SOLUTION | Freq: Four times a day (QID) | ORAL | 0 refills | Status: DC | PRN

## 2022-08-18 MED ORDER — AMOXICILLIN-POT CLAVULANATE 875-125 MG PO TABS: 1.0000 | ORAL_TABLET | Freq: Two times a day (BID) | ORAL | 0 refills | Status: DC

## 2022-08-18 NOTE — ED Provider Notes (Signed)
[facility]    CSN: [id number] Arrival date & time: 08/18/22  1536      History   Chief Complaint Chief Complaint  Patient presents with   Nasal Congestion    3 weeks now. With difficulty breathing. - Entered by patient   Cough    HPI Wesley Cortez. is a 41 y.o. male.   Patient presents with fevers, nasal congestion, rhinorrhea, sore throat, bright adductive cough and shortness of breath for 3 weeks.  Sputum was initially green but has become clear.  Sore throat is described as " feeling raw".  Endorses that at baseline he has sputum production every morning but symptoms have greatly worsened over the last 3 weeks.  Tolerating food and liquids.  No known sick contacts.  Has attempted use of Mucinex, Advil sinus and cold and Tylenol without relief.  Denies wheezing, chest pain or tightness.    Past Medical History:  Diagnosis Date   COVID-19 11/2019   Hypertension    Hypothyroidism    Thyroid disease     There are no problems to display for this patient.   Past Surgical History:  Procedure Laterality Date   ANKLE ARTHROSCOPY Right 05/14/2020   Procedure: A-SCOPE/DEBRIDEMENT;EXTENSIVE RIGHT;  Surgeon: Gwyneth Revels, DPM;  Location: [facility];  Service: Podiatry;  Laterality: Right;   ANKLE RECONSTRUCTION Right 05/14/2020   Procedure: BROSTRUM-GOULD RIGHT;  Surgeon: Gwyneth Revels, DPM;  Location: [facility];  Service: Podiatry;  Laterality: Right;  General with Popliteal   HERNIA REPAIR     x3   KNEE SURGERY Left    TENDON REPAIR Right 05/14/2020   Procedure: FLEXOR TENDON REPAIR - SECONDARY;  Surgeon: Gwyneth Revels, DPM;  Location: [facility];  Service: Podiatry;  Laterality: Right;       Home Medications    Prior to Admission medications   Medication Sig Start Date End Date Taking? Authorizing Provider  albuterol (VENTOLIN HFA) 108 (90 Base) MCG/ACT inhaler Inhale 1-2 puffs into the lungs every 6 (six) hours as needed  for shortness of breath. 12/26/20  Yes Delton See, MD  amphetamine-dextroamphetamine (ADDERALL XR) 15 MG 24 hr capsule TK ONE C PO QD 03/28/19  Yes [provider]  amphetamine-dextroamphetamine (ADDERALL) 5 MG tablet Take 5 mg by mouth daily as needed.   Yes [provider]  DULoxetine (CYMBALTA) 60 MG capsule Take 60 mg by mouth daily. 12/20/19  Yes [provider]  levothyroxine (SYNTHROID, LEVOTHROID) 25 MCG tablet Take 25 mcg by mouth daily before breakfast.   Yes [provider]  LORazepam (ATIVAN) 1 MG tablet Take 1 mg by mouth 3 (three) times daily as needed for anxiety.   Yes [provider]  benzonatate (TESSALON) 100 MG capsule Take 2 capsules (200 mg total) by mouth 3 (three) times daily as needed. 12/26/20   Delton See, MD  cetirizine-pseudoephedrine (ZYRTEC-D) 5-120 MG tablet Take 1 tablet by mouth daily. 12/26/20   Delton See, MD  diltiazem [facility]) 240 MG 24 hr capsule Take 240 mg by mouth daily.    [provider]  fluticasone (FLONASE) 50 MCG/ACT nasal spray Place 2 sprays into both nostrils daily. 12/26/20   Delton See, MD  gabapentin (NEURONTIN) 300 MG capsule TK 1 TO 3 CS PO QHS 04/12/19   [provider]  hydrochlorothiazide (HYDRODIURIL) 25 MG tablet Take 25 mg by mouth daily.    [provider]  MELATONIN PO Take by mouth at bedtime as needed.  [provider]  prazosin (MINIPRESS) 1 MG capsule Take 1 mg by mouth 2 (two) times daily.    [provider]  escitalopram (LEXAPRO) 10 MG tablet TK 1 T PO QD 04/12/19 08/22/19  [provider]    Family History Family History  Problem Relation Age of Onset   Allergies Mother    Diabetes Mother        diet controlled   Hypertension Mother    Heart attack Father 37   Lung cancer Father    Hypertension Father    Hyperlipidemia Father     Social History Social History   Tobacco Use   Smoking status: Never    Smokeless tobacco: Current    Types: Chew  Vaping Use   Vaping Use: Never used  Substance Use Topics   Alcohol use: Yes    Comment: occassional   Drug use: Never     Allergies   No known allergies   Review of Systems Review of Systems  Constitutional:  Positive for fever. Negative for activity change, appetite change, chills, diaphoresis, fatigue and unexpected weight change.  HENT:  Positive for congestion, rhinorrhea and sore throat. Negative for dental problem, drooling, ear discharge, ear pain, facial swelling, hearing loss, mouth sores, nosebleeds, postnasal drip, sinus pressure, sinus pain, sneezing, tinnitus, trouble swallowing and voice change.   Respiratory:  Positive for cough and shortness of breath. Negative for apnea, choking, chest tightness, wheezing and stridor.   Gastrointestinal: Negative.   Neurological: Negative.      Physical Exam Triage Vital Signs ED Triage Vitals  Enc Vitals Group     BP 08/18/22 1649 (!) 146/91     Pulse Rate 08/18/22 1649 (!) 104     Resp 08/18/22 1649 16     Temp 08/18/22 1649 (!) 97.5 F (36.4 C)     Temp Source 08/18/22 1649 Oral     SpO2 08/18/22 1649 95 %     Weight --      Height --      Head Circumference --      Peak Flow --      Pain Score 08/18/22 1647 0     Pain Loc --      Pain Edu? --      Excl. in GC? --    No data found.  Updated Vital Signs BP (!) 146/91 (BP Location: Right Arm)   Pulse (!) 104   Temp (!) 97.5 F (36.4 C) (Oral)   Resp 16   SpO2 95%   Visual Acuity Right Eye Distance:   Left Eye Distance:   Bilateral Distance:    Right Eye Near:   Left Eye Near:    Bilateral Near:     Physical Exam Constitutional:      Appearance: Normal appearance.  HENT:     Right Ear: Tympanic membrane, ear canal and external ear normal.     Left Ear: Tympanic membrane, ear canal and external ear normal.     Nose: Nose normal.     Mouth/Throat:     Mouth: Mucous membranes are moist.     Pharynx:  Posterior oropharyngeal erythema present.  Eyes:     Extraocular Movements: Extraocular movements intact.  Cardiovascular:     Rate and Rhythm: Normal rate and regular rhythm.     Pulses: Normal pulses.     Heart sounds: Normal heart sounds.  Pulmonary:     Effort: Pulmonary effort is normal.     Breath sounds:  Normal breath sounds.  Musculoskeletal:        General: Normal range of motion.  Skin:    General: Skin is warm and dry.  Neurological:     Mental Status: He is alert and oriented to person, place, and time. Mental status is at baseline.  Psychiatric:        Mood and Affect: Mood normal.        Behavior: Behavior normal.      UC Treatments / Results  Labs (all labs ordered are listed, but only abnormal results are displayed) Labs Reviewed - No data to display  EKG   Radiology No results found.  Procedures Procedures (including critical care time)  Medications Ordered in UC Medications - No data to display  Initial Impression / Assessment and Plan / UC Course  I have reviewed the triage vital signs and the nursing notes.  Pertinent labs & imaging results that were available during my care of the patient were reviewed by me and considered in my medical decision making (see chart for details).  Acute upper respiratory infection  Vital signs are stable patient while ill-appearing disease no strong to distress nor toxic, will defer viral testing due to timeline of symptoms, will defer imaging as lungs are clear to auscultation and O2 saturation is 95% on room air,, as symptoms have been present for 3-week Promethazine DM for manage over-the-counter medications for additional support, will follow-up with urgent care primary doctor if symptoms continue to persist Final Clinical Impressions(s) / UC Diagnoses   Final diagnoses:  None   Discharge Instructions   None    ED Prescriptions   None    PDMP not reviewed this encounter.   Valinda Hoar,  NP 08/18/22 1730

## 2022-08-18 NOTE — Discharge Instructions (Signed)
Begin Augmentin every morning and every evening for 7 days  Starting tomorrow take prednisone every morning with food for 5 days to reduce irritation and inflammation to the airway  You may use cough syrup every 6 hours for additional comfort, be mindful this will make you drowsy  You may continue over-the-counter medication, may attempt any of the following below in addition.    You can take Tylenol and/or Ibuprofen as needed for fever reduction and pain relief.   For cough: honey 1/2 to 1 teaspoon (you can dilute the honey in water or another fluid).  You can also use guaifenesin and dextromethorphan for cough. You can use a humidifier for chest congestion and cough.  If you don't have a humidifier, you can sit in the bathroom with the hot shower running.      For sore throat: try warm salt water gargles, cepacol lozenges, throat spray, warm tea or water with lemon/honey, popsicles or ice, or OTC cold relief medicine for throat discomfort.   For congestion: take a daily anti-histamine like Zyrtec, Claritin, and a oral decongestant, such as pseudoephedrine.  You can also use Flonase 1-2 sprays in each nostril daily.   It is important to stay hydrated: drink plenty of fluids (water, gatorade/powerade/pedialyte, juices, or teas) to keep your throat moisturized and help further relieve irritation/discomfort.

## 2022-08-18 NOTE — ED Triage Notes (Addendum)
Chief Complaint: congestion and SOB. Patient states he is prone to sinus infections. Productive cough with "excessive" production. No respiratory history. States his throat is raw. No fever or chills.   Onset: 3 weeks ago.   Prescriptions or OTC medications tried: Yes- Mucinex. Advil cold & sinus, tylenol    with mild relief  Sick exposure: No  New foods, medications, or products: No  Recent Travel: No

## 2022-09-11 ENCOUNTER — Ambulatory Visit
Admission: EM | Admit: 2022-09-11 | Discharge: 2022-09-11 | Disposition: A | Discharge status: Home / Self Care | Payer: Self-pay | Attending: Family Medicine | Admitting: Family Medicine

## 2022-09-11 ENCOUNTER — Ambulatory Visit (INDEPENDENT_AMBULATORY_CARE_PROVIDER_SITE_OTHER): Payer: Self-pay

## 2022-09-11 ENCOUNTER — Other Ambulatory Visit: Payer: Self-pay

## 2022-09-11 DIAGNOSIS — J40 Bronchitis, not specified as acute or chronic: Secondary | ICD-10-CM

## 2022-09-11 DIAGNOSIS — R059 Cough, unspecified: Secondary | ICD-10-CM

## 2022-09-11 DIAGNOSIS — R0989 Other specified symptoms and signs involving the circulatory and respiratory systems: Secondary | ICD-10-CM

## 2022-09-11 MED ORDER — PREDNISONE 10 MG (21) PO TBPK: ORAL_TABLET | Freq: Every day | ORAL | 0 refills | Status: DC

## 2022-09-11 MED ORDER — ATROVENT HFA 17 MCG/ACT IN AERS: 2.0000 | INHALATION_SPRAY | Freq: Four times a day (QID) | RESPIRATORY_TRACT | 12 refills | Status: AC | PRN

## 2022-09-11 MED ORDER — LEVOFLOXACIN 750 MG PO TABS: 750.0000 mg | ORAL_TABLET | Freq: Every day | ORAL | 0 refills | Status: AC

## 2022-09-11 NOTE — ED Provider Notes (Incomplete)
[facility]    CSN: [id number] Arrival date & time: 09/11/22  1323      History   Chief Complaint Chief Complaint  Patient presents with   Cough    HPI Salman Wellen. is a 41 y.o. male.   HPI   Juma presents for ongoing cough. Was seen 3 weeks ago and treated with steroids and antibiotics.  His symptoms never went away.  A couple days before Christmas his symptoms came back with a vengeance. Has been waking up at night and feels a rattle in his chest which causes him to cough. Has productive cough. Has hoarse voice. He gets winded walking 20 ft. No recent fever.       Past Medical History:  Diagnosis Date   COVID-19 11/2019   Hypertension    Hypothyroidism    Thyroid disease     There are no problems to display for this patient.   Past Surgical History:  Procedure Laterality Date   ANKLE ARTHROSCOPY Right 05/14/2020   Procedure: A-SCOPE/DEBRIDEMENT;EXTENSIVE RIGHT;  Surgeon: Gwyneth Revels, DPM;  Location: [facility];  Service: Podiatry;  Laterality: Right;   ANKLE RECONSTRUCTION Right 05/14/2020   Procedure: BROSTRUM-GOULD RIGHT;  Surgeon: Gwyneth Revels, DPM;  Location: [facility];  Service: Podiatry;  Laterality: Right;  General with Popliteal   HERNIA REPAIR     x3   KNEE SURGERY Left    TENDON REPAIR Right 05/14/2020   Procedure: FLEXOR TENDON REPAIR - SECONDARY;  Surgeon: Gwyneth Revels, DPM;  Location: [facility];  Service: Podiatry;  Laterality: Right;       Home Medications    Prior to Admission medications   Medication Sig Start Date End Date Taking? Authorizing Provider  albuterol (VENTOLIN HFA) 108 (90 Base) MCG/ACT inhaler Inhale 1-2 puffs into the lungs every 6 (six) hours as needed for shortness of breath. 12/26/20   Delton See, MD  amoxicillin-clavulanate (AUGMENTIN) 875-125 MG tablet Take 1 tablet by mouth every 12 (twelve) hours. 08/18/22   White, Elita Boone, NP  amphetamine-dextroamphetamine  (ADDERALL XR) 15 MG 24 hr capsule TK ONE C PO QD 03/28/19   [provider]  amphetamine-dextroamphetamine (ADDERALL) 5 MG tablet Take 5 mg by mouth daily as needed.    [provider]  benzonatate (TESSALON) 100 MG capsule Take 2 capsules (200 mg total) by mouth 3 (three) times daily as needed. 12/26/20   Delton See, MD  cetirizine-pseudoephedrine (ZYRTEC-D) 5-120 MG tablet Take 1 tablet by mouth daily. 12/26/20   Delton See, MD  diltiazem [facility]) 240 MG 24 hr capsule Take 240 mg by mouth daily.    [provider]  DULoxetine (CYMBALTA) 60 MG capsule Take 60 mg by mouth daily. 12/20/19   [provider]  fluticasone (FLONASE) 50 MCG/ACT nasal spray Place 2 sprays into both nostrils daily. 12/26/20   Delton See, MD  gabapentin (NEURONTIN) 300 MG capsule TK 1 TO 3 CS PO QHS 04/12/19   [provider]  hydrochlorothiazide (HYDRODIURIL) 25 MG tablet Take 25 mg by mouth daily.    [provider]  levothyroxine (SYNTHROID, LEVOTHROID) 25 MCG tablet Take 25 mcg by mouth daily before breakfast.    [provider]  LORazepam (ATIVAN) 1 MG tablet Take 1 mg by mouth 3 (three) times daily as needed for anxiety.    [provider]  MELATONIN PO Take by mouth at bedtime as needed.    [provider]  prazosin (MINIPRESS) 1 MG capsule  Take 1 mg by mouth 2 (two) times daily.    [provider]  predniSONE (DELTASONE) 20 MG tablet Take 2 tablets (40 mg total) by mouth daily. 08/18/22   White, Elita Boone, NP  promethazine-dextromethorphan (PROMETHAZINE-DM) 6.25-15 MG/5ML syrup Take 5 mLs by mouth 4 (four) times daily as needed for cough. 08/18/22   Valinda Hoar, NP  escitalopram (LEXAPRO) 10 MG tablet TK 1 T PO QD 04/12/19 08/22/19  [provider]    Family History Family History  Problem Relation Age of Onset   Allergies Mother    Diabetes Mother        diet controlled   Hypertension Mother    Heart  attack Father 28   Lung cancer Father    Hypertension Father    Hyperlipidemia Father     Social History Social History   Tobacco Use   Smoking status: Never   Smokeless tobacco: Current    Types: Chew  Vaping Use   Vaping Use: Never used  Substance Use Topics   Alcohol use: Yes    Comment: occassional   Drug use: Never     Allergies   No known allergies   Review of Systems Review of Systems: negative unless otherwise stated in HPI.      Physical Exam Triage Vital Signs ED Triage Vitals [09/11/22 1435]  Enc Vitals Group     BP (!) 144/109     Pulse Rate (!) 102     Resp 19     Temp 98.1 F (36.7 C)     Temp Source Oral     SpO2 98 %     Weight (!) 320 lb (145.2 kg)     Height 6\' 4"  (1.93 m)     Head Circumference      Peak Flow      Pain Score 4     Pain Loc      Pain Edu?      Excl. in GC?    No data found.  Updated Vital Signs BP (!) 144/109 (BP Location: Left Arm)   Pulse (!) 102   Temp 98.1 F (36.7 C) (Oral)   Resp 19   Ht 6\' 4"  (1.93 m)   Wt (!) 145.2 kg   SpO2 98%   BMI 38.95 kg/m   Visual Acuity Right Eye Distance:   Left Eye Distance:   Bilateral Distance:    Right Eye Near:   Left Eye Near:    Bilateral Near:     Physical Exam GEN:     alert, non-toxic appearing male in no distress ***   HENT:  mucus membranes moist, oropharyngeal ***without lesions or ***exudate, no*** tonsillar hypertrophy, *** mild oropharyngeal erythema , *** moderate erythematous edematous turbinates, ***clear nasal discharge, ***bilateral TM normal EYES:   pupils equal and reactive, ***no scleral injection or discharge NECK:  normal ROM, no ***lymphadenopathy, ***no meningismus   RESP:  no increased work of breathing, ***clear to auscultation bilaterally CVS:   regular rate ***and rhythm Skin:   warm and dry, no rash on visible skin***    UC Treatments / Results  Labs (all labs ordered are listed, but only abnormal results are displayed) Labs  Reviewed - No data to display  EKG   Radiology No results found.  Procedures Procedures (including critical care time)  Medications Ordered in UC Medications - No data to display  Initial Impression / Assessment and Plan / UC Course  I have reviewed the triage  vital signs and the nursing notes.  Pertinent labs & imaging results that were available during my care of the patient were reviewed by me and considered in my medical decision making (see chart for details).       Pt is a 41 y.o. male who presents for *** days of respiratory symptoms. Serjio is ***afebrile here without recent antipyretics. Satting well on room air. Overall pt is ***non-toxic appearing, well hydrated, without respiratory distress. Pulmonary exam ***is unremarkable.  COVID and influenza testing obtained ***and was negative. ***Pt to quarantine until COVID test results or longer if positive.  I will call patient with test results, if positive. History consistent with ***viral respiratory illness. Discussed symptomatic treatment.  Explained lack of efficacy of antibiotics in viral disease.  Typical duration of symptoms discussed.   Return and ED precautions given and voiced understanding. Discussed MDM, treatment plan and plan for follow-up with patient/guardian*** who agrees with plan.     Final Clinical Impressions(s) / UC Diagnoses   Final diagnoses:  None   Discharge Instructions   None    ED Prescriptions   None    PDMP not reviewed this encounter.

## 2022-09-11 NOTE — Discharge Instructions (Signed)
Stop by the pharmacy to pick up your prescriptions.  Follow up with your primary care provider as needed.  

## 2022-09-11 NOTE — ED Triage Notes (Signed)
Pt states he was seen for same 3 weeks ago and treated with antibiotics and was feeling somewhat better but then started with same symptoms all over again. "Heavy mucous production" now green in color.  Laryngitis.

## 2022-09-12 ENCOUNTER — Ambulatory Visit: Payer: Self-pay

## 2023-11-23 ENCOUNTER — Ambulatory Visit
Admission: EM | Admit: 2023-11-23 | Discharge: 2023-11-23 | Payer: Self-pay | Attending: Emergency Medicine | Admitting: Emergency Medicine

## 2023-11-23 DIAGNOSIS — Z720 Tobacco use: Secondary | ICD-10-CM

## 2023-11-23 DIAGNOSIS — Z91148 Patient's other noncompliance with medication regimen for other reason: Secondary | ICD-10-CM | POA: Insufficient documentation

## 2023-11-23 DIAGNOSIS — I10 Essential (primary) hypertension: Secondary | ICD-10-CM | POA: Insufficient documentation

## 2023-11-23 DIAGNOSIS — R0602 Shortness of breath: Secondary | ICD-10-CM

## 2023-11-23 DIAGNOSIS — F419 Anxiety disorder, unspecified: Secondary | ICD-10-CM | POA: Insufficient documentation

## 2023-11-23 LAB — RESP PANEL BY RT-PCR (FLU A&B, COVID) ARPGX2
Influenza A by PCR: NEGATIVE
Influenza B by PCR: NEGATIVE
SARS Coronavirus 2 by RT PCR: NEGATIVE

## 2023-11-23 NOTE — ED Triage Notes (Signed)
 Patient states that he's been having SOB for the past 2 days. Nausea in the mornings. No smoking-or hx of asthma. Patient states that he has a hx of anxiety. Patient uses smokeless tobacco and gum. Patient states that he has tingling in his fingers

## 2023-11-23 NOTE — ED Provider Notes (Signed)
 [facility]    CSN: [id number] Arrival date & time: 11/23/23  0947      History   Chief Complaint Chief Complaint  Patient presents with   Shortness of Breath    HPI Wesley Cortez. is a 43 y.o. male.   43 year old male, Wesley Cortez, Wesley Cortez., presents to urgent care for evaluation of shortness of breath,tingling in extremities for the past 2 days,nausea in the mornings, patient denies smoking or history of asthma, however he does use excessive amounts of nicotine gum daily per his own report as well as smokeless tobacco, patient drinks beer regularly. Pt denies any chest pain,palpitations. "I just don't feel good". States he feels like   Pt states he has PCP and was "supposed to be on BP and thyroid meds but can't afford meds",has "no insurance", is "self employed", works in [employer].   The history is provided by the patient. No language interpreter was used.    Past Medical History:  Diagnosis Date   COVID-19 11/2019   Hypertension    Hypothyroidism    Thyroid disease     Patient Active Problem List   Diagnosis Date Noted   Nicotine use 11/23/2023   SOB (shortness of breath) 11/23/2023   Noncompliance with medication regimen 11/23/2023   Anxiety 11/23/2023   Uncontrolled hypertension 11/23/2023    Past Surgical History:  Procedure Laterality Date   ANKLE ARTHROSCOPY Right 05/14/2020   Procedure: A-SCOPE/DEBRIDEMENT;EXTENSIVE RIGHT;  Surgeon: Gwyneth Revels, DPM;  Location: [facility];  Service: Podiatry;  Laterality: Right;   ANKLE RECONSTRUCTION Right 05/14/2020   Procedure: BROSTRUM-GOULD RIGHT;  Surgeon: Gwyneth Revels, DPM;  Location: [facility];  Service: Podiatry;  Laterality: Right;  General with Popliteal   HERNIA REPAIR     x3   KNEE SURGERY Left    TENDON REPAIR Right 05/14/2020   Procedure: FLEXOR TENDON REPAIR - SECONDARY;  Surgeon: Gwyneth Revels, DPM;  Location: [facility];  Service: Podiatry;  Laterality:  Right;       Home Medications    Prior to Admission medications   Medication Sig Start Date End Date Taking? Authorizing Provider  albuterol (VENTOLIN HFA) 108 (90 Base) MCG/ACT inhaler Inhale 1-2 puffs into the lungs every 6 (six) hours as needed for shortness of breath. 12/26/20   Delton See, MD  amphetamine-dextroamphetamine (ADDERALL XR) 15 MG 24 hr capsule TK ONE C PO QD 03/28/19   [provider]  amphetamine-dextroamphetamine (ADDERALL) 5 MG tablet Take 5 mg by mouth daily as needed.    [provider]  benzonatate (TESSALON) 100 MG capsule Take 2 capsules (200 mg total) by mouth 3 (three) times daily as needed. 12/26/20   Delton See, MD  cetirizine-pseudoephedrine (ZYRTEC-D) 5-120 MG tablet Take 1 tablet by mouth daily. 12/26/20   Delton See, MD  diltiazem [facility]) 240 MG 24 hr capsule Take 240 mg by mouth daily.    [provider]  DULoxetine (CYMBALTA) 60 MG capsule Take 60 mg by mouth daily. 12/20/19   [provider]  fluticasone (FLONASE) 50 MCG/ACT nasal spray Place 2 sprays into both nostrils daily. 12/26/20   Delton See, MD  gabapentin (NEURONTIN) 300 MG capsule TK 1 TO 3 CS PO QHS 04/12/19   [provider]  hydrochlorothiazide (HYDRODIURIL) 25 MG tablet Take 25 mg by mouth daily.    [provider]  ipratropium (ATROVENT HFA) 17 MCG/ACT inhaler Inhale 2 puffs into the lungs every 6 (six) hours as needed for wheezing.  09/11/22   Katha Cabal, DO  levothyroxine (SYNTHROID, LEVOTHROID) 25 MCG tablet Take 25 mcg by mouth daily before breakfast.    [provider]  LORazepam (ATIVAN) 1 MG tablet Take 1 mg by mouth 3 (three) times daily as needed for anxiety.    [provider]  MELATONIN PO Take by mouth at bedtime as needed.    [provider]  prazosin (MINIPRESS) 1 MG capsule Take 1 mg by mouth 2 (two) times daily.    [provider]  predniSONE (STERAPRED UNI-PAK 21 TAB)  10 MG (21) TBPK tablet Take by mouth daily. Take 6 tabs by mouth daily  for 2 days, then 5 tabs for 2 days, then 4 tabs for 2 days, then 3 tabs for 2 days, 2 tabs for 2 days, then 1 tab by mouth daily for 2 days 09/11/22   Katha Cabal, DO  promethazine-dextromethorphan (PROMETHAZINE-DM) 6.25-15 MG/5ML syrup Take 5 mLs by mouth 4 (four) times daily as needed for cough. 08/18/22   Valinda Hoar, NP  escitalopram (LEXAPRO) 10 MG tablet TK 1 T PO QD 04/12/19 08/22/19  [provider]    Family History Family History  Problem Relation Age of Onset   Allergies Mother    Diabetes Mother        diet controlled   Hypertension Mother    Heart attack Father 70   Lung cancer Father    Hypertension Father    Hyperlipidemia Father     Social History Social History   Tobacco Use   Smoking status: Never   Smokeless tobacco: Current    Types: Chew  Vaping Use   Vaping status: Never Used  Substance Use Topics   Alcohol use: Yes    Comment: occassional   Drug use: Never     Allergies   No known allergies   Review of Systems Review of Systems  Constitutional:  Negative for fever.  Respiratory:  Positive for shortness of breath.   Cardiovascular:  Negative for chest pain, palpitations and leg swelling.  All other systems reviewed and are negative.    Physical Exam Triage Vital Signs ED Triage Vitals [11/23/23 1000]  Encounter Vitals Group     BP (!) 160/107     Systolic BP Percentile      Diastolic BP Percentile      Pulse Rate (!) 127     Resp 19     Temp 97.9 F (36.6 C)     Temp Source Oral     SpO2 93 %     Weight      Height      Head Circumference      Peak Flow      Pain Score      Pain Loc      Pain Education      Exclude from Growth Chart    No data found.  Updated Vital Signs BP (!) 160/107 (BP Location: Right Arm)   Pulse (!) 127   Temp 97.9 F (36.6 C) (Oral)   Resp 19   SpO2 93%   Visual Acuity Right Eye Distance:   Left Eye  Distance:   Bilateral Distance:    Right Eye Near:   Left Eye Near:    Bilateral Near:     Physical Exam Vitals and nursing note reviewed.  Constitutional:      Appearance: He is well-developed and well-groomed. He is ill-appearing.  HENT:     Head: Normocephalic and atraumatic.  Right Ear: Tympanic membrane is retracted.     Left Ear: Tympanic membrane is retracted.     Mouth/Throat:     Lips: Pink.  Eyes:     Conjunctiva/sclera: Conjunctivae normal.  Neck:     Trachea: Trachea normal.  Cardiovascular:     Rate and Rhythm: Regular rhythm. Tachycardia present.     Heart sounds: Normal heart sounds. No murmur heard. Pulmonary:     Effort: Pulmonary effort is normal. No respiratory distress.     Breath sounds: Normal breath sounds.  Abdominal:     Palpations: Abdomen is soft.     Tenderness: There is no abdominal tenderness.  Musculoskeletal:        General: No swelling.  Skin:    General: Skin is warm and dry.     Capillary Refill: Capillary refill takes less than 2 seconds.  Neurological:     General: No focal deficit present.     Mental Status: He is alert and oriented to person, place, and time.     GCS: GCS eye subscore is 4. GCS verbal subscore is 5. GCS motor subscore is 6.     Cranial Nerves: No cranial nerve deficit.     Sensory: No sensory deficit.  Psychiatric:        Attention and Perception: Attention normal.        Mood and Affect: Mood is anxious.        Speech: Speech normal.        Behavior: Behavior normal. Behavior is cooperative.      UC Treatments / Results  Labs (all labs ordered are listed, but only abnormal results are displayed) Labs Reviewed  RESP PANEL BY RT-PCR (FLU A&B, COVID) [id number]    EKG   Radiology No results found.  Procedures Procedures (including critical care time)  Medications Ordered in UC Medications - No data to display  Initial Impression / Assessment and Plan / UC Course  I have reviewed the triage vital  signs and the nursing notes.  Pertinent labs & imaging results that were available during my care of the patient were reviewed by me and considered in my medical decision making (see chart for details).  Clinical Course as of 11/23/23 1131  Wed Nov 23, 2023  1007 EKG shows ST 127,QTC is 424, no STEMI [JD]  1017 Pt offered EMS transport by this provider and RN, pt declined,requests POV will have someone transport him to ER for further evaluation [JD]    Clinical Course User Index [JD] Emmalin Jaquess, Para March, NP    Ddx: SOB, thyroid storm, Nicotine use, anxiety, noncompliance with meds, uncontrolled hypertension Final Clinical Impressions(s) / UC Diagnoses   Final diagnoses:  Nicotine use  SOB (shortness of breath)  Noncompliance with medication regimen  Anxiety  Uncontrolled hypertension     Discharge Instructions      Go to the Er now for further evaluation of SOB,do not eat or drink anything until seen by provider.    ED Prescriptions   None    PDMP not reviewed this encounter.   Clancy Gourd, NP 11/23/23 1131

## 2023-11-23 NOTE — Discharge Instructions (Addendum)
 Go to the Er now for further evaluation of SOB,do not eat or drink anything until seen by provider.

## 2023-11-25 ENCOUNTER — Encounter: Payer: Self-pay | Admitting: Emergency Medicine

## 2023-11-25 ENCOUNTER — Ambulatory Visit
Admission: EM | Admit: 2023-11-25 | Discharge: 2023-11-25 | Disposition: A | Discharge status: Home / Self Care | Payer: Self-pay | Attending: Internal Medicine | Admitting: Internal Medicine

## 2023-11-25 DIAGNOSIS — U071 COVID-19: Secondary | ICD-10-CM

## 2023-11-25 MED ORDER — BENZONATATE 200 MG PO CAPS: 200.0000 mg | ORAL_CAPSULE | Freq: Three times a day (TID) | ORAL | 0 refills | Status: AC | PRN

## 2023-11-25 MED ORDER — HYDROXYCHLOROQUINE SULFATE 200 MG PO TABS
ORAL_TABLET | ORAL | 0 refills | Status: AC
Start: 2023-11-25 — End: ?

## 2023-11-25 NOTE — Discharge Instructions (Signed)
 Stay quarantined for 5 days, then after that wear a mask for 5 days when in public

## 2023-11-25 NOTE — ED Triage Notes (Signed)
 Patient states that he started having runny nose nose and nasal congestion that started yesterday.  Patient states that his mother tested positive for COVID today here.  Patient states that he did a home covid test today and was positive.  Patient brought the test result with him.  Patient states that he does not have health insurance.

## 2023-12-20 ENCOUNTER — Ambulatory Visit
Admit: 2023-12-20 | Discharge: 2023-12-20 | Disposition: A | Discharge status: Home / Self Care | Payer: Self-pay | Attending: Emergency Medicine | Admitting: Emergency Medicine

## 2023-12-20 ENCOUNTER — Ambulatory Visit
Admission: EM | Admit: 2023-12-20 | Discharge: 2023-12-20 | Disposition: A | Discharge status: Home / Self Care | Payer: Self-pay | Attending: Physician Assistant | Admitting: Physician Assistant

## 2023-12-20 ENCOUNTER — Encounter: Payer: Self-pay | Admitting: Emergency Medicine

## 2023-12-20 DIAGNOSIS — L03115 Cellulitis of right lower limb: Secondary | ICD-10-CM | POA: Insufficient documentation

## 2023-12-20 DIAGNOSIS — M79661 Pain in right lower leg: Secondary | ICD-10-CM | POA: Insufficient documentation

## 2023-12-20 DIAGNOSIS — M7989 Other specified soft tissue disorders: Secondary | ICD-10-CM | POA: Insufficient documentation

## 2023-12-20 DIAGNOSIS — M25561 Pain in right knee: Secondary | ICD-10-CM | POA: Insufficient documentation

## 2023-12-20 DIAGNOSIS — I10 Essential (primary) hypertension: Secondary | ICD-10-CM | POA: Insufficient documentation

## 2023-12-20 MED ORDER — CEPHALEXIN 500 MG PO CAPS: 1000.0000 mg | ORAL_CAPSULE | Freq: Two times a day (BID) | ORAL | 0 refills | Status: AC

## 2023-12-20 MED ORDER — NAPROXEN 500 MG PO TABS: 500.0000 mg | ORAL_TABLET | Freq: Two times a day (BID) | ORAL | 0 refills | Status: AC

## 2023-12-20 MED ORDER — KETOROLAC TROMETHAMINE 60 MG/2ML IM SOLN
30.0000 mg | Freq: Once | INTRAMUSCULAR | Status: AC
  Administered 2023-12-20: 30 mg via INTRAMUSCULAR

## 2023-12-20 NOTE — Discharge Instructions (Addendum)
-  Ultrasound does not show clot. - You could have a cyst of the knee that ruptured and led to the swelling but I cannot rule out cellulitis/infection. - Begin antibiotics and also take the anti-inflammatory medicine and Tylenol.  Elevate and ice extremity.  If pain or swelling worsen please go to the ER.  If spreading redness and fever please go to the ER.  Otherwise please follow-up with your PCP if symptoms or not getting better over the next week.

## 2023-12-20 NOTE — ED Provider Notes (Signed)
 [facility]    CSN: [id number] Arrival date & time: 12/20/23  0830      History   Chief Complaint Chief Complaint  Patient presents with   Knee Pain    HPI Wesley Cortez. is a 43 y.o. male with history of hypertension, obesity, hypothyroidism.  Patient presents today for medial right knee pain over the past 1 week.  Denies injury or fall.  Patient says knee pain has been mild.  However, over the past 3 days he has noticed increased swelling from the knee all the way to the ankle.  Reports pain when trying to bear weight on right lower extremity.  Has some calf pain but the knee pain is worse.  Has been taking ibuprofen and Tylenol and that does improve his discomfort.  Patient denies history of DVT or PE, heart attack or stroke.  No history of coagulation problems.  Patient is not immunocompromised and no history of cancer.  No recent surgeries.  Denies sedentary lifestyle.  Reports COVID-19 2 weeks ago.  HPI  Past Medical History:  Diagnosis Date   COVID-19 11/2019   Hypertension    Hypothyroidism    Thyroid disease     Patient Active Problem List   Diagnosis Date Noted   Nicotine use 11/23/2023   SOB (shortness of breath) 11/23/2023   Noncompliance with medication regimen 11/23/2023   Anxiety 11/23/2023   Uncontrolled hypertension 11/23/2023    Past Surgical History:  Procedure Laterality Date   ANKLE ARTHROSCOPY Right 05/14/2020   Procedure: A-SCOPE/DEBRIDEMENT;EXTENSIVE RIGHT;  Surgeon: Gwyneth Revels, DPM;  Location: [facility];  Service: Podiatry;  Laterality: Right;   ANKLE RECONSTRUCTION Right 05/14/2020   Procedure: BROSTRUM-GOULD RIGHT;  Surgeon: Gwyneth Revels, DPM;  Location: [facility];  Service: Podiatry;  Laterality: Right;  General with Popliteal   HERNIA REPAIR     x3   KNEE SURGERY Left    TENDON REPAIR Right 05/14/2020   Procedure: FLEXOR TENDON REPAIR - SECONDARY;  Surgeon: Gwyneth Revels, DPM;  Location: [facility];  Service: Podiatry;  Laterality: Right;       Home Medications    Prior to Admission medications   Medication Sig Start Date End Date Taking? Authorizing Provider  cephALEXin (KEFLEX) 500 MG capsule Take 2 capsules (1,000 mg total) by mouth 2 (two) times daily for 7 days. 12/20/23 12/27/23 Yes Shirlee Latch, PA-C  diltiazem (TIAZAC) 240 MG 24 hr capsule Take 240 mg by mouth daily.   Yes [provider]  levothyroxine (SYNTHROID, LEVOTHROID) 25 MCG tablet Take 25 mcg by mouth daily before breakfast.   Yes [provider]  naproxen (NAPROSYN) 500 MG tablet Take 1 tablet (500 mg total) by mouth 2 (two) times daily. 12/20/23  Yes Shirlee Latch, PA-C  albuterol (VENTOLIN HFA) 108 (90 Base) MCG/ACT inhaler Inhale 1-2 puffs into the lungs every 6 (six) hours as needed for shortness of breath. 12/26/20   Delton See, MD  benzonatate (TESSALON) 200 MG capsule Take 1 capsule (200 mg total) by mouth 3 (three) times daily as needed for cough. 11/25/23   Rodriguez-Southworth, Nettie Elm, PA-C  cetirizine-pseudoephedrine (ZYRTEC-D) 5-120 MG tablet Take 1 tablet by mouth daily. 12/26/20   Delton See, MD  hydroxychloroquine (PLAQUENIL) 200 MG tablet Take 400 mg  day one, then one bid x 7 days for Covid infection 11/25/23   Rodriguez-Southworth, Nettie Elm, PA-C  ipratropium (ATROVENT HFA) 17 MCG/ACT inhaler Inhale 2 puffs into the lungs every 6 (six)  hours as needed for wheezing. 09/11/22   Brimage, Seward Meth, DO  LORazepam (ATIVAN) 1 MG tablet Take 1 mg by mouth 3 (three) times daily as needed for anxiety.    [provider]  MELATONIN PO Take by mouth at bedtime as needed.    [provider]  prazosin (MINIPRESS) 1 MG capsule Take 1 mg by mouth 2 (two) times daily.    [provider]  escitalopram (LEXAPRO) 10 MG tablet TK 1 T PO QD 04/12/19 08/22/19  [provider]    Family History Family History  Problem Relation Age of Onset   Allergies  Mother    Diabetes Mother        diet controlled   Hypertension Mother    Heart attack Father 9   Lung cancer Father    Hypertension Father    Hyperlipidemia Father     Social History Social History   Tobacco Use   Smoking status: Never   Smokeless tobacco: Current    Types: Chew  Vaping Use   Vaping status: Never Used  Substance Use Topics   Alcohol use: Yes    Comment: occassional   Drug use: Never     Allergies   No known allergies   Review of Systems Review of Systems  Constitutional:  Negative for fatigue and fever.  Musculoskeletal:  Positive for arthralgias, gait problem and joint swelling.  Skin:  Positive for color change. Negative for wound.  Neurological:  Negative for weakness and numbness.     Physical Exam Triage Vital Signs ED Triage Vitals  Encounter Vitals Group     BP 12/20/23 0857 (!) 156/92     Systolic BP Percentile --      Diastolic BP Percentile --      Pulse Rate 12/20/23 0857 100     Resp 12/20/23 0857 18     Temp 12/20/23 0857 98 F (36.7 C)     Temp Source 12/20/23 0857 Oral     SpO2 12/20/23 0857 94 %     Weight --      Height --      Head Circumference --      Peak Flow --      Pain Score 12/20/23 0855 4     Pain Loc --      Pain Education --      Exclude from Growth Chart --    No data found.  Updated Vital Signs BP (!) 156/92 (BP Location: Left Arm)   Pulse 100   Temp 98 F (36.7 C) (Oral)   Resp 18   SpO2 94%      Physical Exam Vitals and nursing note reviewed.  Constitutional:      General: He is not in acute distress.    Appearance: Normal appearance. He is well-developed. He is not ill-appearing.  HENT:     Head: Normocephalic and atraumatic.  Eyes:     General: No scleral icterus.    Conjunctiva/sclera: Conjunctivae normal.  Cardiovascular:     Rate and Rhythm: Normal rate and regular rhythm.  Pulmonary:     Effort: Pulmonary effort is normal. No respiratory distress.     Breath sounds: Normal  breath sounds.  Musculoskeletal:     Cervical back: Neck supple.     Comments: RIGHT LOWER EXT: There is mild knee swelling and TTP medial joint line. Full ROM of knee.   Significant swelling of right lower extremity from knee to ankle. +mild calf tenderness. Increased warmth  of skin and slight erythema but patient says the redness is from his boots--present on opposite leg too.  Skin:    General: Skin is warm and dry.     Capillary Refill: Capillary refill takes less than 2 seconds.  Neurological:     General: No focal deficit present.     Mental Status: He is alert. Mental status is at baseline.     Motor: No weakness.     Gait: Gait normal.  Psychiatric:        Mood and Affect: Mood normal.        Behavior: Behavior normal.      UC Treatments / Results  Labs (all labs ordered are listed, but only abnormal results are displayed) Labs Reviewed - No data to display  EKG   Radiology US Venous Img Lower Unilateral Right Result Date: 12/20/2023 CLINICAL DATA:  right lower leg swelling and calf pain. right knee pain. covid 2 weeks ago EXAM: RIGHT LOWER EXTREMITY VENOUS DOPPLER ULTRASOUND TECHNIQUE: Gray-scale sonography with compression, as well as color and duplex ultrasound, were performed to evaluate the deep venous system(s) from the level of the common femoral vein through the popliteal and proximal calf veins. COMPARISON:  Chest XR, 09/11/2022 FINDINGS: VENOUS Normal compressibility of the common femoral, superficial femoral, and popliteal veins, as well as the visualized calf veins. Visualized portions of profunda femoral vein and great saphenous vein unremarkable. No filling defects to suggest DVT on grayscale or color Doppler imaging. Doppler waveforms show normal direction of venous flow, normal respiratory plasticity and response to augmentation. Limited views of the contralateral common femoral vein are unremarkable. OTHER No evidence of superficial thrombophlebitis or abnormal  fluid collection. Limitations: Patient body habitus IMPRESSION: No evidence of femoropopliteal DVT or superficial thrombophlebitis within the RIGHT lower extremity. Roanna Banning, MD [facility] Radiology Electronically Signed   By: Roanna Banning M.D.   On: 12/20/2023 10:57    Procedures Procedures (including critical care time)  Medications Ordered in UC Medications  ketorolac (TORADOL) injection 30 mg (30 mg Intramuscular Given 12/20/23 1106)    Initial Impression / Assessment and Plan / UC Course  I have reviewed the triage vital signs and the nursing notes.  Pertinent labs & imaging results that were available during my care of the patient were reviewed by me and considered in my medical decision making (see chart for details).   43 year old male presents for right medial knee pain over the past several days.  3-day history of swelling of right lower extremity and calf tenderness.  No history of DVT or PE.  COVID-19 2 weeks ago.  Patient was treated for COVID-19 with hydroxychloroquine.  No other risk factors for DVT.  No injury to leg.  Blood pressure elevated at 156/92.  Advised to continue taking prazosin and diltiazem.  Check BP and if consistently greater than 140/90 should follow-up with PCP.  On exam is tenderness of the right medial knee before range of motion of knee.  Mild knee swelling.  Significant swelling of right lower extremity from knee to ankle and calf tenderness.  Will obtain ultrasound of right lower extremity to assess for possible DVT. [place] is negative.   Reviewed imaging results with patient.   Suspect ruptured baker's cyst and possible cellulitis. Patient was given 30 mg IM ketorolac for pain and swelling. Sent naproxen to pharmacy. Will also begin patient on Keflex for suspected cellulitis. Advised following RICE guidelines, Tylenol and rest. Reviewed return and  ED precautions and encouraged him to follow up with  PCP.   Final Clinical Impressions(s) / UC Diagnoses   Final diagnoses:  Pain and swelling of right lower leg  Acute pain of right knee  Cellulitis of right lower leg  Essential hypertension     Discharge Instructions      -Ultrasound does not show clot. - You could have a cyst of the knee that ruptured and led to the swelling but I cannot rule out cellulitis/infection. - Begin antibiotics and also take the anti-inflammatory medicine and Tylenol.  Elevate and ice extremity.  If pain or swelling worsen please go to the ER.  If spreading redness and fever please go to the ER.  Otherwise please follow-up with your PCP if symptoms or not getting better over the next week.     ED Prescriptions     Medication Sig Dispense Auth. Provider   naproxen (NAPROSYN) 500 MG tablet Take 1 tablet (500 mg total) by mouth 2 (two) times daily. 30 tablet Eusebio Friendly B, PA-C   cephALEXin (KEFLEX) 500 MG capsule Take 2 capsules (1,000 mg total) by mouth 2 (two) times daily for 7 days. 28 capsule Shirlee Latch, PA-C      PDMP not reviewed this encounter.   Shirlee Latch, PA-C 12/20/23 1207

## 2023-12-20 NOTE — ED Triage Notes (Signed)
 Pt presents with right knee pain x 1 week. Pt denies any injury. Pt developed swelling from his knee down to his feet x 3 days. Pt has taken ibuprofen and tylenol to relieve the pain.

## 2024-05-04 ENCOUNTER — Encounter: Payer: Self-pay | Admitting: Emergency Medicine

## 2024-05-04 ENCOUNTER — Ambulatory Visit
Admission: EM | Admit: 2024-05-04 | Discharge: 2024-05-04 | Disposition: A | Discharge status: Home / Self Care | Payer: Self-pay | Attending: Family Medicine | Admitting: Family Medicine

## 2024-05-04 DIAGNOSIS — I161 Hypertensive emergency: Secondary | ICD-10-CM

## 2024-05-04 NOTE — ED Provider Notes (Signed)
 [facility]    CSN: [id number] Arrival date & time: 05/04/24  9075      History   Chief Complaint Chief Complaint  Patient presents with   Knee Pain    right    HPI  HPI Wesley Cortez. is a 43 y.o. male.   [place] presents for right knee pain.     Past Medical History:  Diagnosis Date   COVID-19 11/2019   Hypertension    Hypothyroidism    Thyroid disease     Patient Active Problem List   Diagnosis Date Noted   Nicotine use 11/23/2023   SOB (shortness of breath) 11/23/2023   Noncompliance with medication regimen 11/23/2023   Anxiety 11/23/2023   Uncontrolled hypertension 11/23/2023    Past Surgical History:  Procedure Laterality Date   ANKLE ARTHROSCOPY Right 05/14/2020   Procedure: A-SCOPE/DEBRIDEMENT;EXTENSIVE RIGHT;  Surgeon: Ashley Soulier, DPM;  Location: [facility];  Service: Podiatry;  Laterality: Right;   ANKLE RECONSTRUCTION Right 05/14/2020   Procedure: BROSTRUM-GOULD RIGHT;  Surgeon: Ashley Soulier, DPM;  Location: [facility];  Service: Podiatry;  Laterality: Right;  General with Popliteal   HERNIA REPAIR     x3   KNEE SURGERY Left    TENDON REPAIR Right 05/14/2020   Procedure: FLEXOR TENDON REPAIR - SECONDARY;  Surgeon: Ashley Soulier, DPM;  Location: [facility];  Service: Podiatry;  Laterality: Right;       Home Medications    Prior to Admission medications   Medication Sig Start Date End Date Taking? Authorizing Provider  diltiazem (TIAZAC) 240 MG 24 hr capsule Take 240 mg by mouth daily.   Yes [provider]  levothyroxine (SYNTHROID, LEVOTHROID) 25 MCG tablet Take 25 mcg by mouth daily before breakfast.   Yes [provider]  albuterol  (VENTOLIN  HFA) 108 (90 Base) MCG/ACT inhaler Inhale 1-2 puffs into the lungs every 6 (six) hours as needed for shortness of breath. 12/26/20   Gwenn Kent, MD  benzonatate  (TESSALON ) 200 MG capsule Take 1 capsule (200 mg total) by mouth 3  (three) times daily as needed for cough. 11/25/23   Rodriguez-Southworth, Sylvia, PA-C  cetirizine -pseudoephedrine  (ZYRTEC -D) 5-120 MG tablet Take 1 tablet by mouth daily. 12/26/20   Gwenn Kent, MD  hydroxychloroquine  (PLAQUENIL ) 200 MG tablet Take 400 mg  day one, then one bid x 7 days for Covid infection 11/25/23   Rodriguez-Southworth, Sylvia, PA-C  ipratropium (ATROVENT  HFA) 17 MCG/ACT inhaler Inhale 2 puffs into the lungs every 6 (six) hours as needed for wheezing. 09/11/22   Justun Anaya, DO  LORazepam (ATIVAN) 1 MG tablet Take 1 mg by mouth 3 (three) times daily as needed for anxiety.    [provider]  MELATONIN PO Take by mouth at bedtime as needed.    [provider]  naproxen  (NAPROSYN ) 500 MG tablet Take 1 tablet (500 mg total) by mouth 2 (two) times daily. 12/20/23   Arvis Huxley B, PA-C  prazosin (MINIPRESS) 1 MG capsule Take 1 mg by mouth 2 (two) times daily.    [provider]  escitalopram (LEXAPRO) 10 MG tablet TK 1 T PO QD 04/12/19 08/22/19  [provider]    Family History Family History  Problem Relation Age of Onset   Allergies Mother    Diabetes Mother        diet controlled   Hypertension Mother    Heart attack Father 12   Lung cancer Father    Hypertension Father  Hyperlipidemia Father     Social History Social History   Tobacco Use   Smoking status: Never   Smokeless tobacco: Current    Types: Chew  Vaping Use   Vaping status: Never Used  Substance Use Topics   Alcohol use: Yes    Comment: occassional   Drug use: Never     Allergies   No known allergies   Review of Systems Review of Systems: :negative unless otherwise stated in HPI.      Physical Exam Triage Vital Signs ED Triage Vitals  Encounter Vitals Group     BP --      Girls Systolic BP Percentile --      Girls Diastolic BP Percentile --      Boys Systolic BP Percentile --      Boys Diastolic BP Percentile --      Pulse --      Resp --       Temp --      Temp src --      SpO2 --      Weight 05/04/24 0939 (!) 320 lb 1.7 oz (145.2 kg)     Height 05/04/24 0939 6' 4 (1.93 m)     Head Circumference --      Peak Flow --      Pain Score 05/04/24 0938 5     Pain Loc --      Pain Education --      Exclude from Growth Chart --    No data found.  Updated Vital Signs BP (!) 181/139 (BP Location: Right Arm)   Pulse 97   Temp 98.2 F (36.8 C) (Oral)   Resp 18   Ht 6' 4 (1.93 m)   Wt (!) 145.2 kg   SpO2 95%   BMI 38.96 kg/m   Visual Acuity Right Eye Distance:   Left Eye Distance:   Bilateral Distance:    Right Eye Near:   Left Eye Near:    Bilateral Near:     Physical Exam GEN: well appearing male in no acute distress  CVS: well perfused, regular rate  RESP: speaking in full sentences without pause, no respiratory distress  MSK:     UC Treatments / Results  Labs (all labs ordered are listed, but only abnormal results are displayed) Labs Reviewed - No data to display  EKG   Radiology No results found.   Procedures Procedures (including critical care time)  Medications Ordered in UC Medications - No data to display  Initial Impression / Assessment and Plan / UC Course  I have reviewed the triage vital signs and the nursing notes.  Pertinent labs & imaging results that were available during my care of the patient were reviewed by me and considered in my medical decision making (see chart for details).      Pt is a 43 y.o.  male with history of uncontrolled blood pressure presents for acute on chronic knee pain.  Pt initially 196/120 and then 181/139.  Recommended ED evaluation as his BP is extremely high. Pt will go to the ED for treatment.       Final Clinical Impressions(s) / UC Diagnoses   Final diagnoses:  Hypertensive emergency     Discharge Instructions      You have been advised to follow up immediately in the emergency department for concerning signs or symptoms as discussed  during your visit. If you declined EMS transport, please have a family member take you directly  to the ED at this time. Do not delay.   Based on concerns about condition, if you do not follow up in the ED, you may risk poor outcomes including worsening of condition, delayed treatment and potentially life threatening issues. If you have declined to go to the ED at this time, you should call your PCP immediately to set up a follow up appointment.   Go to ED for red flag symptoms, including; fevers you cannot reduce with Tylenol /Motrin, severe headaches, vision changes, numbness/weakness in part of the body, lethargy, confusion, intractable vomiting, severe dehydration, chest pain, breathing difficulty, severe persistent abdominal or pelvic pain, signs of severe infection (increased redness, swelling of an area), feeling faint or passing out, dizziness, etc. You should especially go to the ED for sudden acute worsening of condition if you do not elect to go at this time.       ED Prescriptions   None    PDMP not reviewed this encounter.   Atreyu Mak, DO 05/04/24 [phone]

## 2024-05-04 NOTE — Discharge Instructions (Signed)

## 2024-05-04 NOTE — ED Notes (Signed)
 Patient is being discharged from the Urgent Care and sent to the Emergency Department via POV . Per Dr. Kriste, patient is in need of higher level of care due to HTN. Patient is aware and verbalizes understanding of plan of care.  Vitals:   05/04/24 0940 05/04/24 0943  BP: (!) 196/120 (!) 181/139  Pulse: 97   Resp: 18   Temp: 98.2 F (36.8 C)   SpO2: 95%

## 2024-05-04 NOTE — ED Triage Notes (Signed)
 Pt c/o right knee pain. He states it has been going on since April when he was seen here and had DVT ultrasound (neg). He states he is still having the pain, and swelling in his right knee and foot. He states the pain is more sharp. He states he has tried ibuprofen.
